# Patient Record
Sex: Female | Born: 1980 | Race: White | Hispanic: Yes | Marital: Single | Smoking: Never smoker
Health system: Southern US, Community
[De-identification: ages and names within clinical notes are randomized; demographics above are authoritative.]

## PROBLEM LIST (undated history)

## (undated) DIAGNOSIS — O24410 Gestational diabetes mellitus in pregnancy, diet controlled: Secondary | ICD-10-CM

## (undated) DIAGNOSIS — E119 Type 2 diabetes mellitus without complications: Secondary | ICD-10-CM

## (undated) DIAGNOSIS — Z789 Other specified health status: Secondary | ICD-10-CM

## (undated) DIAGNOSIS — O24419 Gestational diabetes mellitus in pregnancy, unspecified control: Secondary | ICD-10-CM

## (undated) HISTORY — DX: Gestational diabetes mellitus in pregnancy, diet controlled: O24.410

## (undated) HISTORY — PX: NO PAST SURGERIES: SHX2092

---

## 2002-03-10 HISTORY — PX: OTHER SURGICAL HISTORY: SHX169

## 2010-03-10 DIAGNOSIS — O24410 Gestational diabetes mellitus in pregnancy, diet controlled: Secondary | ICD-10-CM

## 2010-03-10 HISTORY — DX: Gestational diabetes mellitus in pregnancy, diet controlled: O24.410

## 2014-11-10 ENCOUNTER — Ambulatory Visit: Payer: Self-pay

## 2014-12-20 ENCOUNTER — Ambulatory Visit (INDEPENDENT_AMBULATORY_CARE_PROVIDER_SITE_OTHER): Payer: Self-pay | Admitting: Internal Medicine

## 2014-12-20 ENCOUNTER — Encounter: Payer: Self-pay | Admitting: Internal Medicine

## 2014-12-20 VITALS — BP 118/70 | HR 66 | Ht <= 58 in | Wt 139.0 lb

## 2014-12-20 DIAGNOSIS — D2371 Other benign neoplasm of skin of right lower limb, including hip: Secondary | ICD-10-CM

## 2014-12-20 NOTE — Progress Notes (Signed)
   Subjective:    Patient ID: Laurie Mahoney, female    DOB: 1980-09-01, 34 y.o.   MRN: [id number]  HPI  Has a mole or pimple on the right leg for 1 year.  Has grown with time.  No bleeding, edges well demarcated, itching or flaking, but getting darker.  Not in an area where gets a lot of sun exposure in past.     Review of Systems     Objective:   Physical Exam Inner mid right thigh:   3-4 mm smooth domed lesion pink with brown coloring ringing dome.  Firm to touch.      Assessment & Plan:  Dermatofibroma:  Discussed generally benign nature and with possibility of causing more skin changes with removing, to leave alone for now. To call if develops sudden increased growth, bleeding, color change, pain or itching from the lesion.

## 2015-02-22 ENCOUNTER — Ambulatory Visit: Payer: No Typology Code available for payment source | Admitting: Internal Medicine

## 2015-03-15 ENCOUNTER — Ambulatory Visit: Payer: No Typology Code available for payment source | Admitting: Internal Medicine

## 2017-05-28 ENCOUNTER — Encounter: Payer: Self-pay | Admitting: Internal Medicine

## 2017-05-28 ENCOUNTER — Ambulatory Visit: Payer: Self-pay | Admitting: Internal Medicine

## 2017-05-28 VITALS — BP 120/70 | HR 80 | Resp 14 | Ht <= 58 in | Wt 134.0 lb

## 2017-05-28 DIAGNOSIS — Z3009 Encounter for other general counseling and advice on contraception: Secondary | ICD-10-CM

## 2017-05-28 DIAGNOSIS — Z3169 Encounter for other general counseling and advice on procreation: Secondary | ICD-10-CM

## 2017-05-28 MED ORDER — PRENATAL VITAMIN 27-0.8 MG PO TABS: 1.0000 | ORAL_TABLET | Freq: Every day | ORAL | 11 refills | Status: AC

## 2017-05-28 NOTE — Progress Notes (Signed)
Subjective:    Patient ID: Laurie Mahoney, female    DOB: 07/04/1980, 37 y.o.   MRN: [id number]  HPI   Here to reestablish  She would like to have another child, but has been unable to get pregnant.  Her child is 7 currently. She is with a different partner than the father of her 4 yo, but he has had children of his own in past.   Her periods are regular.  She has flow for 3-4 days. LNMP 05/12/2017 They do not have intercourse very often.   Patient finally shares that he lives in [place] and they are not often together. Not taking PNV yet  No outpatient medications have been marked as taking for the 05/28/17 encounter (Office Visit) with Mack Hook, MD.   No Known Allergies   Past Medical History:  Diagnosis Date  . Diet controlled gestational diabetes mellitus (GDM) 2012   Past Surgical History:  Procedure Laterality Date  . Epidermal cyst excision of skin Right 2004    No family history on file.   Social History   Socioeconomic History  . Marital status: Single    Spouse name: Not on file  . Number of children: 1  . Years of education: 54  . Highest education level: Not on file  Occupational History  . Occupation: Presenter, broadcasting: [employer]  . Financial resource strain: Not on file  . Food insecurity:    Worry: Not on file    Inability: Not on file  . Transportation needs:    Medical: Not on file    Non-medical: Not on file  Tobacco Use  . Smoking status: Never Smoker  . Smokeless tobacco: Never Used  Substance and Sexual Activity  . Alcohol use: No    Alcohol/week: 0.0 oz  . Drug use: No  . Sexual activity: Not on file  Lifestyle  . Physical activity:    Days per week: Not on file    Minutes per session: Not on file  . Stress: Not on file  Relationships  . Social connections:    Talks on phone: Not on file    Gets together: Not on file    Attends religious service: Not on file    Active member  of club or organization: Not on file    Attends meetings of clubs or organizations: Not on file    Relationship status: Not on file  . Intimate partner violence:    Fear of current or ex partner: Not on file    Emotionally abused: Not on file    Physically abused: Not on file    Forced sexual activity: Not on file  Other Topics Concern  . Not on file  Social History Narrative   Originally from [place]   Came to [place] In 2003   Lives at home with her brother and her young daughter.       Review of Systems     Objective:   Physical Exam  NAD HEENT:  PERRL, EOMI, TMs pearly gray, throat without injection Neck:  Supple, No adenopathy, No thyromegaly Chest:  CTA CV:  RRR with normal S1 and S2, No S3, S4 or murmur.  Radial and DP pulses normal and equal . Abd:  S, NT, No HSM or mass, + BS      Assessment & Plan:  1.  Family planning/?infertility:  History more supportive of couple not being together during patient's ovulation.  Discussed  ovulation generally occurs about 2 weeks after first day of last period, if cycles are regular.  Would recommend intercourse every 2 days during week of ovulation, starting 2 days before she expects to ovulate.  Would need to keep track of her cycles on calendar to get a better idea of timing. To start PNV in preparation for possible pregnancy.

## 2017-09-03 ENCOUNTER — Ambulatory Visit: Payer: Self-pay | Admitting: Internal Medicine

## 2017-09-03 ENCOUNTER — Encounter: Payer: Self-pay | Admitting: Internal Medicine

## 2017-09-03 VITALS — BP 122/72 | HR 64 | Resp 12 | Ht <= 58 in | Wt 137.0 lb

## 2017-09-03 DIAGNOSIS — N898 Other specified noninflammatory disorders of vagina: Secondary | ICD-10-CM

## 2017-09-03 DIAGNOSIS — E782 Mixed hyperlipidemia: Secondary | ICD-10-CM

## 2017-09-03 DIAGNOSIS — Z124 Encounter for screening for malignant neoplasm of cervix: Secondary | ICD-10-CM

## 2017-09-03 DIAGNOSIS — F439 Reaction to severe stress, unspecified: Secondary | ICD-10-CM

## 2017-09-03 DIAGNOSIS — Z8639 Personal history of other endocrine, nutritional and metabolic disease: Secondary | ICD-10-CM

## 2017-09-03 DIAGNOSIS — Z0000 Encounter for general adult medical examination without abnormal findings: Secondary | ICD-10-CM

## 2017-09-03 LAB — POCT WET PREP WITH KOH
KOH Prep POC: NEGATIVE
RBC WET PREP PER HPF POC: NEGATIVE
Trichomonas, UA: NEGATIVE
YEAST WET PREP PER HPF POC: NEGATIVE

## 2017-09-03 NOTE — Progress Notes (Signed)
Subjective:    Patient ID: Laurie Mahoney, female    DOB: 09/12/1980, 37 y.o.   MRN: [id number]  HPI   CPE with pap  1.  Pap:  Last pap was 3 years ago.  Always normal.  2.  Mammogram:  Never.  No family history of breast cancer.  3.  Osteoprevention:  Milk once daily, yogurt and cheese 3 times weekly.  She is physically active daily for 60 minutes daily with jogging or walking.  No family history of osteoporosis.  4.  Guaiac Cards:  Never.  No family history of colon cancer.  5.  Colonoscopy:  Never  6.  Immunizations: She had Tdap in hospital in [place].  7.  Glucose/Cholesterol:  History of GDM with her daughter.  Has been told her cholesterol is a bit elevated.    No outpatient medications have been marked as taking for the 09/03/17 encounter (Office Visit) with Mack Hook, MD.    No Known Allergies   Past Medical History:  Diagnosis Date  . Diet controlled gestational diabetes mellitus (GDM) 2012    Past Surgical History:  Procedure Laterality Date  . Epidermal cyst excision of skin Right 2004    No family history on file.   Social History   Socioeconomic History  . Marital status: Single    Spouse name: Not on file  . Number of children: 1  . Years of education: 76  . Highest education level: Not on file  Occupational History  . Occupation: Presenter, broadcasting: [employer]  . Financial resource strain: Not on file  . Food insecurity:    Worry: Never true    Inability: Never true  . Transportation needs:    Medical: No    Non-medical: No  Tobacco Use  . Smoking status: Never Smoker  . Smokeless tobacco: Never Used  Substance and Sexual Activity  . Alcohol use: No    Alcohol/week: 0.0 oz  . Drug use: No  . Sexual activity: Not Currently    Birth control/protection: None    Comment: No longer with her previous boyfriend.  Lifestyle  . Physical activity:    Days per week: 7 days    Minutes  per session: 60 min  . Stress: Only a little  Relationships  . Social connections:    Talks on phone: More than three times a week    Gets together: Never    Attends religious service: More than 4 times per year    Active member of club or organization: No    Attends meetings of clubs or organizations: Never    Relationship status: Not on file  . Intimate partner violence:    Fear of current or ex partner: No    Emotionally abused: No    Physically abused: No    Forced sexual activity: No  Other Topics Concern  . Not on file  Social History Narrative   [place]ame to [place] In [place]   Lives at home with her brother and her young daughter.   Daughter stays with mom's friend during the day as out of school currently   She is thinking about cutting back on work hours to spend more time with her daughter.    Review of Systems  Constitutional: Negative for appetite change and fatigue.  HENT: Negative for dental problem, ear pain, hearing loss, rhinorrhea, sinus pain and sore throat.   Eyes: Negative for visual disturbance.  Respiratory: Negative for  cough and shortness of breath.   Cardiovascular: Negative for chest pain, palpitations and leg swelling.  Gastrointestinal: Negative for abdominal pain, blood in stool, constipation and diarrhea.  Genitourinary: Negative for dysuria, frequency, menstrual problem and vaginal discharge.  Musculoskeletal:       Right hand sometimes hurts--feels related to work  Neurological: Positive for numbness (sometimes of fingers of right hand, she has not paid attention to which ones.). Negative for weakness.  Psychiatric/Behavioral: Negative for dysphoric mood. The patient is not nervous/anxious.        Objective:   Physical Exam  Constitutional: She is oriented to person, place, and time. She appears well-developed and well-nourished.  HENT:  Head: Normocephalic and atraumatic.  Right Ear: Hearing, tympanic membrane, external ear and ear canal normal.    Left Ear: Hearing, tympanic membrane, external ear and ear canal normal.  Nose: Nose normal.  Mouth/Throat: Uvula is midline, oropharynx is clear and moist and mucous membranes are normal.  Eyes: Pupils are equal, round, and reactive to light. Conjunctivae and EOM are normal.  Discs sharp bilaterally  Neck: Normal range of motion and full passive range of motion without pain. Neck supple. No thyromegaly present.  Cardiovascular: Normal rate, regular rhythm, S1 normal and S2 normal. Exam reveals no S3, no S4 and no friction rub.  No murmur heard. Carotid, radial, femoral, DP and PT pulses normal and equal. No LE edema.  Pulmonary/Chest: Effort normal and breath sounds normal. Right breast exhibits no inverted nipple, no mass, no nipple discharge, no skin change and no tenderness. Left breast exhibits no inverted nipple, no mass, no nipple discharge, no skin change and no tenderness.  Abdominal: Soft. Bowel sounds are normal. She exhibits no mass. There is no hepatosplenomegaly. There is no tenderness. No hernia.  Genitourinary:  Genitourinary Comments: Normal external female genitalia. No vaginal or cervical lesions. Mild yellow vaginal discharge. No uterine or adnexal mass or tenderness.   Musculoskeletal: Normal range of motion.  Lymphadenopathy:       Head (right side): No submental and no submandibular adenopathy present.       Head (left side): No submental and no submandibular adenopathy present.    She has no cervical adenopathy.    She has no axillary adenopathy.       Right: No inguinal and no supraclavicular adenopathy present.       Left: No inguinal and no supraclavicular adenopathy present.  Neurological: She is alert and oriented to person, place, and time. She has normal strength and normal reflexes. No cranial nerve deficit or sensory deficit. Coordination and gait normal.  Skin: Skin is warm. Capillary refill takes less than 2 seconds. No lesion and no rash noted.   Psychiatric: She has a normal mood and affect. Her speech is normal and behavior is normal. Judgment and thought content normal. Cognition and memory are normal.          Assessment & Plan:  1.  CPE with pap As with some vaginal discharge, check GC/chlamydia. Wet prep supports BV, but patient asymptomatic, so hold on treatment. CBC  2.  Hyperlipidemia:  FLP  3.  Hyperglycemia history:  CMP  4.  Stress:  Referral to Macie Burows, LCSW

## 2017-09-04 LAB — CBC WITH DIFFERENTIAL/PLATELET
Basophils Absolute: 0 10*3/uL (ref 0.0–0.2)
Basos: 0 %
EOS (ABSOLUTE): 0 10*3/uL (ref 0.0–0.4)
EOS: 1 %
HEMATOCRIT: 36.4 % (ref 34.0–46.6)
Hemoglobin: 12.5 g/dL (ref 11.1–15.9)
IMMATURE GRANULOCYTES: 0 %
Immature Grans (Abs): 0 10*3/uL (ref 0.0–0.1)
LYMPHS ABS: 1.9 10*3/uL (ref 0.7–3.1)
Lymphs: 29 %
MCH: 29.7 pg (ref 26.6–33.0)
MCHC: 34.3 g/dL (ref 31.5–35.7)
MCV: 87 fL (ref 79–97)
MONOS ABS: 0.3 10*3/uL (ref 0.1–0.9)
Monocytes: 4 %
NEUTROS PCT: 66 %
Neutrophils Absolute: 4.5 10*3/uL (ref 1.4–7.0)
PLATELETS: 298 10*3/uL (ref 150–450)
RBC: 4.21 x10E6/uL (ref 3.77–5.28)
RDW: 14.1 % (ref 12.3–15.4)
WBC: 6.8 10*3/uL (ref 3.4–10.8)

## 2017-09-04 LAB — COMPREHENSIVE METABOLIC PANEL
A/G RATIO: 1.5 (ref 1.2–2.2)
ALT: 16 IU/L (ref 0–32)
AST: 17 IU/L (ref 0–40)
Albumin: 4.4 g/dL (ref 3.5–5.5)
Alkaline Phosphatase: 67 IU/L (ref 39–117)
BUN/Creatinine Ratio: 22 (ref 9–23)
BUN: 13 mg/dL (ref 6–20)
Bilirubin Total: 0.4 mg/dL (ref 0.0–1.2)
CO2: 20 mmol/L (ref 20–29)
Calcium: 9.5 mg/dL (ref 8.7–10.2)
Chloride: 105 mmol/L (ref 96–106)
Creatinine, Ser: 0.6 mg/dL (ref 0.57–1.00)
GFR, EST AFRICAN AMERICAN: 136 mL/min/{1.73_m2} (ref >59)
GFR, EST NON AFRICAN AMERICAN: 118 mL/min/{1.73_m2} (ref >59)
GLOBULIN, TOTAL: 3 g/dL (ref 1.5–4.5)
Glucose: 78 mg/dL (ref 65–99)
POTASSIUM: 4.1 mmol/L (ref 3.5–5.2)
SODIUM: 136 mmol/L (ref 134–144)
Total Protein: 7.4 g/dL (ref 6.0–8.5)

## 2017-09-04 LAB — LIPID PANEL W/O CHOL/HDL RATIO
Cholesterol, Total: 191 mg/dL (ref 100–199)
HDL: 55 mg/dL (ref >39)
LDL Calculated: 119 mg/dL — ABNORMAL HIGH (ref 0–99)
Triglycerides: 83 mg/dL (ref 0–149)
VLDL Cholesterol Cal: 17 mg/dL (ref 5–40)

## 2017-09-04 LAB — CYTOLOGY - PAP

## 2017-09-05 LAB — GC/CHLAMYDIA PROBE AMP
CHLAMYDIA, DNA PROBE: NEGATIVE
Neisseria gonorrhoeae by PCR: NEGATIVE

## 2017-09-25 ENCOUNTER — Other Ambulatory Visit: Payer: Self-pay | Admitting: Licensed Clinical Social Worker

## 2018-03-10 NOTE — L&D Delivery Note (Signed)
OB/GYN Faculty Practice Delivery Note  Laurie Mahoney is a 38 y.o. G2P1001 s/p vaginal delivery at [redacted]w[redacted]d. She was admitted for IOL 2/2 pre-E w/o SF, A1GDM.   ROM: 5h 10m with clear fluid GBS Status: positive Maximum Maternal Temperature: 98.63F  Labor Progress: . Patient was admitted for induction. She received 2 doses of cytotec, and was AROMed with clear fluid. She progressed without pitocin.  Delivery Date/Time: 03/06/2019, 2322 hours Delivery: Called to room and patient was complete and pushing. Head delivered LOA. No nuchal cord present. Shoulder and body delivered in usual fashion. Infant with spontaneous cry, placed on mother's abdomen, dried and stimulated. Cord clamped x 2 after 1-minute delay, and cut by patient under my direct supervision. Cord blood drawn. Placenta delivered spontaneously with gentle cord traction. Fundus firm with massage and Pitocin. Several clots were evacuated from the vaginal. After second fundal rub, there was a large gush of bright red blood, so 1 g TXA was given. Labia, perineum, vagina, and cervix were inspected, and a first degree laceration was noted, repaired with 3-0 Vicryl Rapide in the usual fashion.   Placenta: 3 vessel cord, intact, to labor and delivery Complications: None Lacerations: 1st degree, repaired with 3-0 Vicryl Rapide in the usual fashion. EBL: 282 Analgesia: Epidural  Postpartum Planning [x]  message to sent to schedule follow-up  [x]  vaccines UTD  Infant: female  APGARs 9 & 9  weight per medical record  Merilyn Baba, DO OB/GYN Fellow, Faculty Practice

## 2018-09-01 LAB — OB RESULTS CONSOLE ABO/RH: RH Type: POSITIVE

## 2018-09-01 LAB — OB RESULTS CONSOLE HGB/HCT, BLOOD
HCT: 36 (ref 29–41)
Hemoglobin: 11.7

## 2018-09-01 LAB — OB RESULTS CONSOLE RPR: RPR: NONREACTIVE

## 2018-09-01 LAB — CYSTIC FIBROSIS DIAGNOSTIC STUDY: Interpretation-CFDNA:: NEGATIVE

## 2018-09-01 LAB — OB RESULTS CONSOLE VARICELLA ZOSTER ANTIBODY, IGG: Varicella: IMMUNE

## 2018-09-01 LAB — OB RESULTS CONSOLE RUBELLA ANTIBODY, IGM: Rubella: NON-IMMUNE/NOT IMMUNE

## 2018-09-01 LAB — SICKLE CELL SCREEN: Sickle Cell Screen: NORMAL

## 2018-09-01 LAB — OB RESULTS CONSOLE HIV ANTIBODY (ROUTINE TESTING): HIV: NONREACTIVE

## 2018-09-01 LAB — OB RESULTS CONSOLE GC/CHLAMYDIA
Chlamydia: NEGATIVE
Gonorrhea: NEGATIVE

## 2018-09-01 LAB — OB RESULTS CONSOLE PLATELET COUNT: Platelets: 292

## 2018-09-01 LAB — GLUCOSE, 1 HOUR GESTATIONAL: Glucose 1 Hour: 110

## 2018-09-01 LAB — OB RESULTS CONSOLE ANTIBODY SCREEN: Antibody Screen: NEGATIVE

## 2018-09-30 ENCOUNTER — Encounter: Payer: Self-pay | Admitting: Internal Medicine

## 2019-01-06 LAB — GLUCOSE TOLERANCE, 1 HOUR: Glucose, GTT - 1 Hour: 164 (ref ?–200)

## 2019-01-06 LAB — OB RESULTS CONSOLE HGB/HCT, BLOOD
HCT: 30 (ref 29–41)
Hemoglobin: 9.7

## 2019-01-10 LAB — GLUCOSE TOLERANCE, 3 HOURS
Glucose, GTT - 1 Hour: 221 — AB (ref ?–200)
Glucose, GTT - 2 Hour: 226 — AB (ref ?–140)
Glucose, GTT - 3 Hour: 157 mg/dL — AB (ref ?–140)
Glucose, GTT - Fasting: 103 mg/dL (ref 80–110)

## 2019-01-12 ENCOUNTER — Encounter: Payer: Self-pay | Admitting: *Deleted

## 2019-01-17 ENCOUNTER — Ambulatory Visit (INDEPENDENT_AMBULATORY_CARE_PROVIDER_SITE_OTHER): Payer: Self-pay | Admitting: Family Medicine

## 2019-01-17 ENCOUNTER — Other Ambulatory Visit: Payer: Self-pay

## 2019-01-17 ENCOUNTER — Encounter: Payer: Self-pay | Admitting: Family Medicine

## 2019-01-17 VITALS — BP 131/79 | HR 84 | Wt 146.6 lb

## 2019-01-17 DIAGNOSIS — O280 Abnormal hematological finding on antenatal screening of mother: Secondary | ICD-10-CM

## 2019-01-17 DIAGNOSIS — O0993 Supervision of high risk pregnancy, unspecified, third trimester: Secondary | ICD-10-CM

## 2019-01-17 DIAGNOSIS — Z283 Underimmunization status: Secondary | ICD-10-CM

## 2019-01-17 DIAGNOSIS — Z2839 Other underimmunization status: Secondary | ICD-10-CM

## 2019-01-17 DIAGNOSIS — Z3A30 30 weeks gestation of pregnancy: Secondary | ICD-10-CM

## 2019-01-17 DIAGNOSIS — O0990 Supervision of high risk pregnancy, unspecified, unspecified trimester: Secondary | ICD-10-CM | POA: Insufficient documentation

## 2019-01-17 DIAGNOSIS — O24410 Gestational diabetes mellitus in pregnancy, diet controlled: Secondary | ICD-10-CM

## 2019-01-17 DIAGNOSIS — O09899 Supervision of other high risk pregnancies, unspecified trimester: Secondary | ICD-10-CM | POA: Insufficient documentation

## 2019-01-17 DIAGNOSIS — O99891 Other specified diseases and conditions complicating pregnancy: Secondary | ICD-10-CM

## 2019-01-17 DIAGNOSIS — O09529 Supervision of elderly multigravida, unspecified trimester: Secondary | ICD-10-CM | POA: Insufficient documentation

## 2019-01-17 DIAGNOSIS — O09523 Supervision of elderly multigravida, third trimester: Secondary | ICD-10-CM

## 2019-01-17 NOTE — Progress Notes (Signed)
   Subjective:  Laurie Mahoney is a 38 y.o. G2P1001 at 21w1dbeing seen today for ongoing prenatal care.  She is currently monitored for the following issues for this high-risk pregnancy and has Diet controlled gestational diabetes mellitus (GDM); Supervision of high risk pregnancy, antepartum; Advanced maternal age in multigravida; Abnormal quad screen; and Rubella non-immune status, antepartum on their problem list.  Patient reports no complaints.  Contractions: Not present. Vag. Bleeding: Bloody Show.  Movement: Present. Denies leaking of fluid.   The following portions of the patient's history were reviewed and updated as appropriate: allergies, current medications, past family history, past medical history, past social history, past surgical history and problem list. Problem list updated.  Objective:   Vitals:   01/17/19 1405  BP: 131/79  Pulse: 84  Weight: 146 lb 9.6 oz (66.5 kg)    Fetal Status: Fetal Heart Rate (bpm): 152 Fundal Height: 29 cm Movement: Present     General:  Alert, oriented and cooperative. Patient is in no acute distress.  Skin: Skin is warm and dry. No rash noted.   Cardiovascular: Normal heart rate noted  Respiratory: Normal respiratory effort, no problems with respiration noted  Abdomen: Soft, gravid, appropriate for gestational age. Pain/Pressure: Present     Pelvic: Vag. Bleeding: Bloody Show     Cervical exam deferred        Extremities: Normal range of motion.  Edema: None  Mental Status: Normal mood and affect. Normal behavior. Normal judgment and thought content.   Urinalysis:      Assessment and Plan:  Pregnancy: G2P1001 at 31w1d1. Supervision of high risk pregnancy, antepartum [facility] of [facility] Discussed contraception briefly If NSVD would like OCPs or Depo If needs to have CS would like to have BTL Sign BTL papers at next visit  2. Multigravida of advanced maternal age in third trimester Referred  for growth [place] she will need weekly BPP starting at 36wks - [facility] OB FOLLOW UP; Future  3. Diet controlled gestational diabetes mellitus (GDM) in third trimester A1c today and remaining baseline labs as below Referred to Nutrition for counseling and supplies but had GDM with prior pregnancy and knows how to check sugars/diabetic diet Too late to benefit from prenatal ASA - HgB A1c - Comp Met (CMET) - Protein / creatinine ratio, urine - [place] FOLLOW UP; Future - Referral to Nutrition and Diabetes Services   4. Abnormal quad screen Increased risk for Down Syndrome on quad Undecided on obtaining NIPS for planning purposes/possible financial cost Has [employer] paperwork, will let us[place]ow at next visit  5. Rubella non-immune status, antepartum MMR PP  Preterm labor symptoms and general obstetric precautions including but not limited to vaginal bleeding, contractions, leaking of fluid and fetal movement were reviewed in detail with the patient. Please refer to After Visit Summary for other counseling recommendations.   [facility]31/2019[facility].   EcClarnce FlockMD

## 2019-01-17 NOTE — Patient Instructions (Signed)
° °Lactancia materna °Breastfeeding ° °Decidir amamantar es una de las mejores elecciones que puede hacer por usted y su bebé. Un cambio en las hormonas durante el embarazo hace que las mamas produzcan leche materna en las glándulas productoras de leche. Las hormonas impiden que la leche materna sea liberada antes del nacimiento del bebé. Además, impulsan el flujo de leche luego del nacimiento. Una vez que ha comenzado a amamantar, pensar en el bebé, así como la succión o el llanto, pueden estimular la liberación de leche de las glándulas productoras de leche. °Los beneficios de amamantar °Las investigaciones demuestran que la lactancia materna ofrece muchos beneficios de salud para bebés y madres. Además, ofrece una forma gratuita y conveniente de alimentar al bebé. °Para el bebé °· La primera leche (calostro) ayuda a mejorar el funcionamiento del aparato digestivo del bebé. °· Las células especiales de la leche (anticuerpos) ayudan a combatir las infecciones en el bebé. °· Los bebés que se alimentan con leche materna también tienen menos probabilidades de tener asma, alergias, obesidad o diabetes de tipo 2. Además, tienen menor riesgo de sufrir el síndrome de muerte súbita del lactante (SMSL). °· Los nutrientes de la leche materna son mejores para satisfacer las necesidades del bebé en comparación con la leche maternizada. °· La leche materna mejora el desarrollo cerebral del bebé. °Para usted °· La lactancia materna favorece el desarrollo de un vínculo muy especial entre la madre y el bebé. °· Es conveniente. La leche materna es económica y siempre está disponible a la temperatura correcta. °· La lactancia materna ayuda a quemar calorías. Le ayuda a perder el peso ganado durante el embarazo. °· Hace que el útero vuelva al tamaño que tenía antes del embarazo más rápido. Además, disminuye el sangrado (loquios) después del parto. °· La lactancia materna contribuye a reducir el riesgo de tener diabetes de tipo 2,  osteoporosis, artritis reumatoide, enfermedades cardiovasculares y cáncer de mama, ovario, útero y endometrio en el futuro. °Información básica sobre la lactancia °Comienzo de la lactancia °· Encuentre un lugar cómodo para sentarse o acostarse, con un buen respaldo para el cuello y la espalda. °· Coloque una almohada o una manta enrollada debajo del bebé para acomodarlo a la altura de la mama (si está sentada). Las almohadas para amamantar se han diseñado especialmente a fin de servir de apoyo para los brazos y el bebé mientras amamanta. °· Asegúrese de que la barriga del bebé (abdomen) esté frente a la suya. °· Masajee suavemente la mama. Con las yemas de los dedos, masajee los bordes exteriores de la mama hacia adentro, en dirección al pezón. Esto estimula el flujo de leche. Si la leche fluye lentamente, es posible que deba continuar con este movimiento durante la lactancia. °· Sostenga la mama con 4 dedos por debajo y el pulgar por arriba del pezón (forme la letra “C” con la mano). Asegúrese de que los dedos se encuentren lejos del pezón y de la boca del bebé. °· Empuje suavemente los labios del bebé con el pezón o con el dedo. °· Cuando la boca del bebé se abra lo suficiente, acérquelo rápidamente a la mama e introduzca todo el pezón y la aréola, tanto como sea posible, dentro de la boca del bebé. La aréola es la zona de color que rodea al pezón. °? Debe haber más aréola visible por arriba del labio superior del bebé que por debajo del labio inferior. °? Los labios del bebé deben estar abiertos y extendidos hacia afuera (evertidos) para asegurar   que el bebé se prenda de forma adecuada y cómoda. °? La lengua del bebé debe estar entre la encía inferior y la mama. °· Asegúrese de que la boca del bebé esté en la posición correcta alrededor del pezón (prendido). Los labios del bebé deben crear un sello sobre la mama y estar doblados hacia afuera (invertidos). °· Es común que el bebé succione durante 2 a 3 minutos  para que comience el flujo de leche materna. °Cómo debe prenderse °Es muy importante que le enseñe al bebé cómo prenderse adecuadamente a la mama. Si el bebé no se prende adecuadamente, puede causar dolor en los pezones, reducir la producción de leche materna y hacer que el bebé tenga un escaso aumento de peso. Además, si el bebé no se prende adecuadamente al pezón, puede tragar aire durante la alimentación. Esto puede causarle molestias al bebé. Hacer eructar al bebé al cambiar de mama puede ayudarlo a liberar el aire. Sin embargo, enseñarle al bebé cómo prenderse a la mama adecuadamente es la mejor manera de evitar que se sienta molesto por tragar aire mientras se alimenta. °Signos de que el bebé se ha prendido adecuadamente al pezón °· Tironea o succiona de modo silencioso, sin causarle dolor. Los labios del bebé deben estar extendidos hacia afuera (evertidos). °· Se escucha que traga cada 3 o 4 succiones una vez que la leche ha comenzado a fluir (después de que se produzca el reflejo de eyección de la leche). °· Hay movimientos musculares por arriba y por delante de sus oídos al succionar. °Signos de que el bebé no se ha prendido adecuadamente al pezón °· Hace ruidos de succión o de chasquido mientras se alimenta. °· Siente dolor en los pezones. °Si cree que el bebé no se prendió correctamente, deslice el dedo en la comisura de la boca y colóquelo entre las encías del bebé para interrumpir la succión. Intente volver a comenzar a amamantar. °Signos de lactancia materna exitosa °Signos del bebé °· El bebé disminuirá gradualmente el número de succiones o dejará de succionar por completo. °· El bebé se quedará dormido. °· El cuerpo del bebé se relajará. °· El bebé retendrá una pequeña cantidad de leche en la boca. °· El bebé se desprenderá solo del pecho. °Signos que presenta usted °· Las mamas han aumentado la firmeza, el peso y el tamaño 1 a 3 horas después de amamantar. °· Están más blandas inmediatamente después  de amamantar. °· Se producen un aumento del volumen de leche y un cambio en su consistencia y color hacia el quinto día de lactancia. °· Los pezones no duelen, no están agrietados ni sangran. °Signos de que su bebé recibe la cantidad de leche suficiente °· Mojar por lo menos 1 o 2 pañales durante las primeras 24 horas después del nacimiento. °· Mojar por lo menos 5 o 6 pañales cada 24 horas durante la primera semana después del nacimiento. La orina debe ser clara o de color amarillo pálido a los 5 días de vida. °· Mojar entre 6 y 8 pañales cada 24 horas a medida que el bebé sigue creciendo y desarrollándose. °· Defeca por lo menos 3 veces en 24 horas a los 5 días de vida. Las heces deben ser blandas y amarillentas. °· Defeca por lo menos 3 veces en 24 horas a los 7 días de vida. Las heces deben ser grumosas y amarillentas. °· No registra una pérdida de peso mayor al 10 % del peso al nacer durante los primeros 3 días de vida. °· Aumenta de peso un promedio de 4   a 7 onzas (113 a 198 g) por semana después de los 4 días de vida. °· Aumenta de peso, diariamente, de manera uniforme a partir de los 5 días de vida, sin registrar pérdida de peso después de las 2 semanas de vida. °Después de alimentarse, es posible que el bebé regurgite una pequeña cantidad de leche. Esto es normal. °Frecuencia y duración de la lactancia °El amamantamiento frecuente la ayudará a producir más leche y puede prevenir dolores en los pezones y las mamas extremadamente llenas (congestión mamaria). Alimente al bebé cuando muestre signos de hambre o si siente la necesidad de reducir la congestión de las mamas. Esto se denomina "lactancia a demanda". Las señales de que el bebé tiene hambre incluyen las siguientes: °· Aumento del estado de alerta, actividad o inquietud. °· Mueve la cabeza de un lado a otro. °· Abre la boca cuando se le toca la mejilla o la comisura de la boca (reflejo de búsqueda). °· Aumenta las vocalizaciones, tales como sonidos de  succión, se relame los labios, emite arrullos, suspiros o chirridos. °· Mueve la mano hacia la boca y se chupa los dedos o las manos. °· Está molesto o llora. °Evite el uso del chupete en las primeras 4 a 6 semanas después del nacimiento del bebé. Después de este período, podrá usar un chupete. Las investigaciones demostraron que el uso del chupete durante el primer año de vida del bebé disminuye el riesgo de tener el síndrome de muerte súbita del lactante (SMSL). °Permita que el niño se alimente en cada mama todo lo que desee. Cuando el bebé se desprende o se queda dormido mientras se está alimentando de la primera mama, ofrézcale la segunda. Debido a que, con frecuencia, los recién nacidos están somnolientos las primeras semanas de vida, es posible que deba despertar al bebé para alimentarlo. °Los horarios de lactancia varían de un bebé a otro. Sin embargo, las siguientes reglas pueden servir como guía para ayudarla a garantizar que el bebé se alimenta adecuadamente: °· Se puede amamantar a los recién nacidos (bebés de 4 semanas o menos de vida) cada 1 a 3 horas. °· No deben transcurrir más de 3 horas durante el día o 5 horas durante la noche sin que se amamante a los recién nacidos. °· Debe amamantar al bebé un mínimo de 8 veces en un período de 24 horas. °Extracción de leche materna ° °  ° °La extracción y el almacenamiento de la leche materna le permiten asegurarse de que el bebé se alimente exclusivamente de su leche materna, aun en momentos en los que no puede amamantar. Esto tiene especial importancia si debe regresar al trabajo en el período en que aún está amamantando o si no puede estar presente en los momentos en que el bebé debe alimentarse. Su asesor en lactancia puede ayudarla a encontrar un método de extracción que funcione mejor para usted y orientarla sobre cuánto tiempo es seguro almacenar leche materna. °Cómo cuidar las mamas durante la lactancia °Los pezones pueden secarse, agrietarse y doler  durante la lactancia. Las siguientes recomendaciones pueden ayudarla a mantener las mamas humectadas y sanas: °· Evite usar jabón en los pezones. °· Use un sostén de soporte diseñado especialmente para la lactancia materna. Evite usar sostenes con aro o sostenes muy ajustados (sostenes deportivos). °· Seque al aire sus pezones durante 3 a 4 minutos después de amamantar al bebé. °· Utilice solo apósitos de algodón en el sostén para absorber las pérdidas de leche. La pérdida de un poco de leche materna entre   las tomas es normal. °· Utilice lanolina sobre los pezones luego de amamantar. La lanolina ayuda a mantener la humedad normal de la piel. La lanolina pura no es perjudicial (no es tóxica) para el bebé. Además, puede extraer manualmente algunas gotas de leche materna y masajear suavemente esa leche sobre los pezones para que la leche se seque al aire. °Durante las primeras semanas después del nacimiento, algunas mujeres experimentan congestión mamaria. La congestión mamaria puede hacer que sienta las mamas pesadas, calientes y sensibles al tacto. El pico de la congestión mamaria ocurre en el plazo de los 3 a 5 días después del parto. Las siguientes recomendaciones pueden ayudarla a aliviar la congestión mamaria: °· Vacíe por completo las mamas al amamantar o extraer leche. Puede aplicar calor húmedo en las mamas (en la ducha o con toallas húmedas para manos) antes de amamantar o extraer leche. Esto aumenta la circulación y ayuda a que la leche fluya. Si el bebé no vacía por completo las mamas cuando lo amamanta, extraiga la leche restante después de que haya finalizado. °· Aplique compresas de hielo sobre las mamas inmediatamente después de amamantar o extraer leche, a menos que le resulte demasiado incómodo. Haga lo siguiente: °? Ponga el hielo en una bolsa plástica. °? Coloque una toalla entre la piel y la bolsa de hielo. °? Coloque el hielo durante 20 minutos, 2 o 3 veces por día. °· Asegúrese de que el bebé  esté prendido y se encuentre en la posición correcta mientras lo alimenta. °Si la congestión mamaria persiste luego de 48 horas o después de seguir estas recomendaciones, comuníquese con su médico o un asesor en lactancia. °Recomendaciones de salud general durante la lactancia °· Consuma 3 comidas y 3 colaciones saludables todos los días. Las madres bien alimentadas que amamantan necesitan entre 450 y 500 calorías adicionales por día. Puede cumplir con este requisito al aumentar la cantidad de una dieta equilibrada que realice. °· Beba suficiente agua para mantener la orina clara o de color amarillo pálido. °· Descanse con frecuencia, relájese y siga tomando sus vitaminas prenatales para prevenir la fatiga, el estrés y los niveles bajos de vitaminas y minerales en el cuerpo (deficiencias de nutrientes). °· No consuma ningún producto que contenga nicotina o tabaco, como cigarrillos y cigarrillos electrónicos. El bebé puede verse afectado por las sustancias químicas de los cigarrillos que pasan a la leche materna y por la exposición al humo ambiental del tabaco. Si necesita ayuda para dejar de fumar, consulte al médico. °· Evite el consumo de alcohol. °· No consuma drogas ilegales o marihuana. °· Antes de usar cualquier medicamento, hable con el médico. Estos incluyen medicamentos recetados y de venta libre, como también vitaminas y suplementos a base de hierbas. Algunos medicamentos, que pueden ser perjudiciales para el bebé, pueden pasar a través de la leche materna. °· Puede quedar embarazada durante la lactancia. Si se desea un método anticonceptivo, consulte al médico sobre cuáles son las opciones seguras durante la lactancia. °Dónde encontrar más información: °Liga internacional [place]ec[place]: www.llli.org. °Comuníquese con un médico si: °· Siente que quiere dejar de amamantar o se siente frustrada con la lactancia. °· Sus pezones están agrietados o sangran. °· Sus mamas están irritadas, sensibles o  calientes. °· Tiene los siguientes síntomas: °? Dolor en las mamas o en los pezones. °? Un área hinchada en cualquiera de las mamas. °? Fiebre o escalofríos. °? Náuseas o vómitos. °? Drenaje de otro líquido distinto de la leche materna desde los pezones. °· Sus mamas no   se llenan antes de amamantar al bebé para el quinto día después del parto. °· Se siente triste y deprimida. °· El bebé: °? Está demasiado somnoliento como para comer bien. °? Tiene problemas para dormir. °? Tiene más de 1 semana de vida y moja menos de 6 pañales en un periodo de 24 horas. °? No ha aumentado de peso a los 5 días de vida. °· El bebé defeca menos de 3 veces en 24 horas. °· La piel del bebé o las partes blancas de los ojos se vuelven amarillentas. °Solicite ayuda de inmediato si: °· El bebé está muy cansado (letargo) y no se quiere despertar para comer. °· Le sube la fiebre sin causa. °Resumen °· La lactancia materna ofrece muchos beneficios de salud para bebés y madres. °· Intente amamantar a su bebé cuando muestre signos tempranos de hambre. °· Haga cosquillas o empuje suavemente los labios del bebé con el dedo o el pezón para lograr que el bebé abra la boca. Acerque el bebé a la mama. Asegúrese de que la mayor parte de la aréola se encuentre dentro de la boca del bebé. Ofrézcale una mama y haga eructar al bebé antes de pasar a la otra. °· Hable con su médico o asesor en lactancia si tiene dudas o problemas con la lactancia. °Esta información no tiene como fin reemplazar el consejo del médico. Asegúrese de hacerle al médico cualquier pregunta que tenga. °Document Released: 02/24/2005 Document Revised: 05/21/2017 Document Reviewed: 06/16/2016 °Elsevier Patient Education © 2020 [employer] ° °

## 2019-01-18 DIAGNOSIS — O0990 Supervision of high risk pregnancy, unspecified, unspecified trimester: Secondary | ICD-10-CM

## 2019-01-18 LAB — COMPREHENSIVE METABOLIC PANEL
ALT: 15 IU/L (ref 0–32)
AST: 17 IU/L (ref 0–40)
Albumin/Globulin Ratio: 1.2 (ref 1.2–2.2)
Albumin: 3.5 g/dL — ABNORMAL LOW (ref 3.8–4.8)
Alkaline Phosphatase: 159 IU/L — ABNORMAL HIGH (ref 39–117)
BUN/Creatinine Ratio: 16 (ref 9–23)
BUN: 7 mg/dL (ref 6–20)
Bilirubin Total: <0.2 mg/dL (ref 0.0–1.2)
CO2: 19 mmol/L — ABNORMAL LOW (ref 20–29)
Calcium: 9.2 mg/dL (ref 8.7–10.2)
Chloride: 104 mmol/L (ref 96–106)
Creatinine, Ser: 0.44 mg/dL — ABNORMAL LOW (ref 0.57–1.00)
GFR calc Af Amer: 149 mL/min/{1.73_m2} (ref >59)
GFR calc non Af Amer: 129 mL/min/{1.73_m2} (ref >59)
Globulin, Total: 2.9 g/dL (ref 1.5–4.5)
Glucose: 80 mg/dL (ref 65–99)
Potassium: 3.2 mmol/L — ABNORMAL LOW (ref 3.5–5.2)
Sodium: 137 mmol/L (ref 134–144)
Total Protein: 6.4 g/dL (ref 6.0–8.5)

## 2019-01-18 LAB — CBC
Hematocrit: 29.2 % — ABNORMAL LOW (ref 34.0–46.6)
Hemoglobin: 9.6 g/dL — ABNORMAL LOW (ref 11.1–15.9)
MCH: 27.4 pg (ref 26.6–33.0)
MCHC: 32.9 g/dL (ref 31.5–35.7)
MCV: 83 fL (ref 79–97)
Platelets: 308 10*3/uL (ref 150–450)
RBC: 3.51 x10E6/uL — ABNORMAL LOW (ref 3.77–5.28)
RDW: 15.5 % — ABNORMAL HIGH (ref 11.7–15.4)
WBC: 8.4 10*3/uL (ref 3.4–10.8)

## 2019-01-18 LAB — PROTEIN / CREATININE RATIO, URINE
Creatinine, Urine: 29.2 mg/dL
Protein, Ur: 18.2 mg/dL
Protein/Creat Ratio: 623 mg/g creat — ABNORMAL HIGH (ref 0–200)

## 2019-01-18 LAB — HEMOGLOBIN A1C
Est. average glucose Bld gHb Est-mCnc: 117 mg/dL
Hgb A1c MFr Bld: 5.7 % — ABNORMAL HIGH (ref 4.8–5.6)

## 2019-01-19 ENCOUNTER — Other Ambulatory Visit: Payer: Self-pay

## 2019-01-20 ENCOUNTER — Telehealth: Payer: Self-pay | Admitting: Family Medicine

## 2019-01-20 MED ORDER — IRON 325 (65 FE) MG PO TABS: 1.0000 | ORAL_TABLET | ORAL | 0 refills | Status: AC

## 2019-01-20 NOTE — Telephone Encounter (Signed)
Please call patient to let her know that blood tests from her first visit showed two things:  1. There is an abnormal amount of protein in her urine. Because this is late in the pregnancy I am not sure of the cause, but it's possible that this could be an early sign of pre-eclampsia. For right now she DOES NOT have pre-eclampsia but we need to keep a close eye on her.  Let her know that we would like to see her for a BP check at least once a week so that we can monitor more closely. Please schedule her for nurse BP checks in between her q2 week prenatal visits.  2. She has a mild anemia and I will send iron pills to her pharmacy, she should take them every other day at bedtime.

## 2019-01-21 ENCOUNTER — Encounter: Payer: Self-pay | Attending: Family Medicine | Admitting: Registered"

## 2019-01-21 ENCOUNTER — Encounter: Payer: Self-pay | Admitting: Registered"

## 2019-01-21 ENCOUNTER — Ambulatory Visit (HOSPITAL_COMMUNITY): Payer: Self-pay

## 2019-01-21 ENCOUNTER — Other Ambulatory Visit: Payer: Self-pay

## 2019-01-21 DIAGNOSIS — O24410 Gestational diabetes mellitus in pregnancy, diet controlled: Secondary | ICD-10-CM | POA: Insufficient documentation

## 2019-01-21 NOTE — Telephone Encounter (Addendum)
I called Shaquella with [facility] and we were unable to leave a message- no voicemail. We called her contact number and heard a busy signal Linda,RN

## 2019-01-21 NOTE — Progress Notes (Signed)
Patient was seen on 01/21/2019 for Gestational Diabetes self-management education at the [facility]. The following learning objectives were met by the patient during this course:   States the definition of Gestational Diabetes  Describes the effects each nutrient has on blood glucose levels  Demonstrates ability to create a balanced meal plan  Demonstrates carbohydrate counting   States when to check blood glucose levels  Demonstrates proper blood glucose monitoring techniques  States the effect of stress and exercise on blood glucose levels  States the importance of limiting caffeine and abstaining from alcohol and smoking  Blood glucose monitor given: Accu[employer] Guide me Lot # [id number] Exp: 05/25/19 Blood glucose reading: 131 mg/dL  Patient instructed to monitor glucose levels: FBS: 60 - <95; 1 hour: <140; 2 hour: <120  Patient received handouts:  Nutrition Diabetes and Pregnancy, including carb counting list  Patient will be seen for follow-up as needed.

## 2019-01-21 NOTE — Telephone Encounter (Signed)
[employer]pre[employer] and I called patient number several times and no answer or voicemail.  Also called her contact number and no answer. Will send letter.  Dolce Sylvia,RN

## 2019-01-25 ENCOUNTER — Inpatient Hospital Stay (HOSPITAL_COMMUNITY)
Admission: EM | Admit: 2019-01-25 | Discharge: 2019-01-26 | Payer: No Typology Code available for payment source | Attending: Family Medicine | Admitting: Family Medicine

## 2019-01-25 ENCOUNTER — Encounter (HOSPITAL_COMMUNITY): Payer: Self-pay | Admitting: *Deleted

## 2019-01-25 ENCOUNTER — Telehealth: Payer: Self-pay | Admitting: Obstetrics and Gynecology

## 2019-01-25 DIAGNOSIS — Y92410 Unspecified street and highway as the place of occurrence of the external cause: Secondary | ICD-10-CM | POA: Diagnosis not present

## 2019-01-25 DIAGNOSIS — Y999 Unspecified external cause status: Secondary | ICD-10-CM | POA: Diagnosis not present

## 2019-01-25 DIAGNOSIS — Z3A31 31 weeks gestation of pregnancy: Secondary | ICD-10-CM

## 2019-01-25 DIAGNOSIS — O9A213 Injury, poisoning and certain other consequences of external causes complicating pregnancy, third trimester: Secondary | ICD-10-CM

## 2019-01-25 DIAGNOSIS — M549 Dorsalgia, unspecified: Secondary | ICD-10-CM | POA: Diagnosis not present

## 2019-01-25 DIAGNOSIS — Z3A38 38 weeks gestation of pregnancy: Secondary | ICD-10-CM | POA: Diagnosis not present

## 2019-01-25 DIAGNOSIS — Y9389 Activity, other specified: Secondary | ICD-10-CM | POA: Insufficient documentation

## 2019-01-25 DIAGNOSIS — O99891 Other specified diseases and conditions complicating pregnancy: Secondary | ICD-10-CM | POA: Diagnosis not present

## 2019-01-25 HISTORY — DX: Other specified health status: Z78.9

## 2019-01-25 HISTORY — DX: Type 2 diabetes mellitus without complications: E11.9

## 2019-01-25 HISTORY — DX: Gestational diabetes mellitus in pregnancy, unspecified control: O24.419

## 2019-01-25 MED ORDER — LACTATED RINGERS IV BOLUS
1000.0000 mL | Freq: Once | INTRAVENOUS | Status: AC
  Administered 2019-01-25: 1000 mL via INTRAVENOUS
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
## 2019-01-25 NOTE — Telephone Encounter (Signed)
Spanish interpreter Eda attempted to contact patient about her appointment on 11/18 @ 10:15. No answer and Eda was unable to leave a message.

## 2019-01-25 NOTE — ED Notes (Signed)
Per Enid Skeens - [place] states to send pt to MAU
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
## 2019-01-26 ENCOUNTER — Encounter: Payer: Self-pay | Admitting: Registered"

## 2019-01-26 ENCOUNTER — Encounter (HOSPITAL_COMMUNITY): Payer: Self-pay

## 2019-01-26 MED ORDER — COMFORT FIT MATERNITY SUPP LG MISC: 1.0000 [IU] | Freq: Every day | 0 refills | Status: DC

## 2019-01-26 NOTE — MAU Note (Signed)
Pt reports no abdominal pain or back pain. Pt reports +FM

## 2019-01-26 NOTE — Discharge Instructions (Signed)
PREGNANCY SUPPORT BELT: You are not alone, Seventy-five percent of women have some sort of abdominal or back pain at some point in their pregnancy. Your baby is growing at a fast pace, which means that your whole body is rapidly trying to adjust to the changes. As your uterus grows, your back may start feeling a bit under stress and this can result in back or abdominal pain that can go from mild, and therefore bearable, to severe pains that will not allow you to sit or lay down comfortably, When it comes to dealing with pregnancy-related pains and cramps, some pregnant women usually prefer natural remedies, which the market is filled with nowadays. For example, wearing a pregnancy support belt can help ease and lessen your discomfort and pain. WHAT ARE THE BENEFITS OF WEARING A PREGNANCY SUPPORT BELT? A pregnancy support belt provides support to the lower portion of the belly taking some of the weight of the growing uterus and distributing to the other parts of your body. It is designed make you comfortable and gives you extra support. Over the years, the pregnancy apparel market has been studying the needs and wants of pregnant women and they have come up with the most comfortable pregnancy support belts that woman could ever ask for. In fact, you will no longer have to wear a stretched-out or bulky pregnancy belt that is visible underneath your clothes and makes you feel even more uncomfortable. Nowadays, a pregnancy support belt is made of comfortable and stretchy materials that will not irritate your skin but will actually make you feel at ease and you will not even notice you are wearing it. They are easy to put on and adjust during the day and can be worn at night for additional support.  BENEFITS:  Relives Back pain  Relieves Abdominal Muscle and Leg Pain  Stabilizes the Pelvic Ring  Offers a Cushioned Abdominal Lift Pad  Relieves pressure on the Sciatic Nerve Within Minutes WHERE TO GET  YOUR PREGNANCY BELT: [employer] [phone] [address]  [place], [place] [address]    Prevencin de lesiones durante el embarazo Preventing Injuries During Pregnancy  Durante el embarazo, pueden producirse lesiones. Generalmente, las cadas y los accidentes menores no la daan ni daan al beb. No obstante, algunas lesiones pueden causarles dao a usted y a su beb. Informe al mdico si sufre alguna lesin. Qu puedo hacer para evitar lesiones? [place] y los objetos sueltos del piso.  Use un calzado cmodo que tenga buena adherencia. No use zapatos con tacones altos.  Use siempre el cinturn de seguridad en el automvil. El cinturn del regazo siempre debe colocarse por debajo del abdomen. Conduzca siempre de [employer].  No ande en motocicleta. Actividad  No participe en actividades o deportes bruscos y violentos.  Evite lo siguiente: ? Caminar en pisos mojados o resbalosos. ? Levantar ollas pesadas que contengan lquidos en ebullicin o calientes. ? Arreglar problemas [place]. ? Estar cerca del fuego. Instrucciones generales  [employer] de venta libre y los recetados solamente como se lo haya indicado el mdico.  Conozca su tipo de sangre y el tipo de sangre del padre del beb.  Si usted es vctima de violencia domstica: ? Comunquese con el servicio de emergencias de su localidad (911 en los [place]). ? Para obtener ayuda y 10, pngase en contacto con [facility] ([place]). Solicite ayuda de inmediato  si:  Se cae sobre el abdomen o recibe algn golpe fuerte en el abdomen.  Tiene dolor o rigidez en el cuello despus de una cada o lesin.  Tiene dolor de cabeza o problemas de visin despus de sufrir una lesin.  No siente al beb moverse, o el beb no se mueve tanto como lo hace normalmente.  Ha sido vctima de violencia  domstica o cualquier otro tipo de violencia.  Tuvo un accidente automovilstico.  Tiene una hemorragia vaginal.  Tiene una prdida de lquido por la vagina.  Empieza a Education officer, environmental o clicos abdominales (contracciones).  Tiene un dolor muy intenso en la parte inferior de la espalda.  Se siente dbil o pierde la conciencia (se desmaya).  Empieza a vomitar despus de sufrir una lesin.  Ha sufrido una [place]. Resumen  Algunas lesiones que se producen durante el embarazo pueden daar al beb.  Informe al mdico si sufre alguna lesin.  Tome medidas para evitar lesiones. Esto incluye retirar las alfombrillas y los objetos sueltos del piso. Use siempre el cinturn de seguridad en el automvil.  No participe en actividades o deportes bruscos y violentos.  Busque ayuda de inmediato si tiene un accidente o una lesin graves. Esta informacin no tiene Marine scientist el consejo del mdico. Asegrese de hacerle al mdico cualquier pregunta que tenga. Document Released: 06/11/2010 Document Revised: 11/05/2016 Document Reviewed: 11/05/2016 Elsevier Patient Education  2020 [employer].

## 2019-01-27 ENCOUNTER — Other Ambulatory Visit: Payer: Self-pay

## 2019-01-27 ENCOUNTER — Ambulatory Visit (INDEPENDENT_AMBULATORY_CARE_PROVIDER_SITE_OTHER): Payer: Self-pay | Admitting: Obstetrics and Gynecology

## 2019-01-27 ENCOUNTER — Encounter: Payer: Self-pay | Admitting: Obstetrics and Gynecology

## 2019-01-27 VITALS — BP 139/75 | HR 85 | Temp 97.7°F | Wt 146.9 lb

## 2019-01-27 DIAGNOSIS — O09523 Supervision of elderly multigravida, third trimester: Secondary | ICD-10-CM

## 2019-01-27 DIAGNOSIS — Z2839 Other underimmunization status: Secondary | ICD-10-CM

## 2019-01-27 DIAGNOSIS — O0990 Supervision of high risk pregnancy, unspecified, unspecified trimester: Secondary | ICD-10-CM

## 2019-01-27 DIAGNOSIS — O280 Abnormal hematological finding on antenatal screening of mother: Secondary | ICD-10-CM

## 2019-01-27 DIAGNOSIS — O0993 Supervision of high risk pregnancy, unspecified, third trimester: Secondary | ICD-10-CM

## 2019-01-27 DIAGNOSIS — O99891 Other specified diseases and conditions complicating pregnancy: Secondary | ICD-10-CM

## 2019-01-27 DIAGNOSIS — O1403 Mild to moderate pre-eclampsia, third trimester: Secondary | ICD-10-CM

## 2019-01-27 DIAGNOSIS — Z3A31 31 weeks gestation of pregnancy: Secondary | ICD-10-CM

## 2019-01-27 DIAGNOSIS — O1400 Mild to moderate pre-eclampsia, unspecified trimester: Secondary | ICD-10-CM | POA: Insufficient documentation

## 2019-01-27 DIAGNOSIS — Z283 Underimmunization status: Secondary | ICD-10-CM

## 2019-01-27 DIAGNOSIS — O24410 Gestational diabetes mellitus in pregnancy, diet controlled: Secondary | ICD-10-CM
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
## 2019-01-31 ENCOUNTER — Other Ambulatory Visit: Payer: Self-pay

## 2019-01-31 ENCOUNTER — Ambulatory Visit: Payer: Self-pay | Admitting: Lactation Services

## 2019-01-31 DIAGNOSIS — O0990 Supervision of high risk pregnancy, unspecified, unspecified trimester: Secondary | ICD-10-CM

## 2019-01-31 DIAGNOSIS — O24410 Gestational diabetes mellitus in pregnancy, diet controlled: Secondary | ICD-10-CM

## 2019-01-31 MED ORDER — ACCU-CHEK GUIDE VI STRP: ORAL_STRIP | 12 refills | Status: DC

## 2019-01-31 MED ORDER — ACCU-CHEK FASTCLIX LANCETS MISC: 1.0000 | Freq: Four times a day (QID) | 6 refills | Status: AC

## 2019-01-31 NOTE — Progress Notes (Signed)
Pt here in the office with a Accu Chek Guide Meter and Prodigy Lancets and strips. She reports the supplies do not work with her meter.   Pt reports she is not sure how to change out her drums. She was shown how to use and how to load drums. Orders sent to pharmacy for Accu Chek Guide supplies.

## 2019-02-02 ENCOUNTER — Encounter: Payer: Self-pay | Admitting: General Practice

## 2019-02-07 ENCOUNTER — Encounter: Payer: Self-pay | Admitting: *Deleted

## 2019-02-08 ENCOUNTER — Other Ambulatory Visit: Payer: Self-pay | Admitting: *Deleted

## 2019-02-08 DIAGNOSIS — O1403 Mild to moderate pre-eclampsia, third trimester: Secondary | ICD-10-CM

## 2019-02-09 ENCOUNTER — Other Ambulatory Visit: Payer: Self-pay

## 2019-02-09 ENCOUNTER — Ambulatory Visit (HOSPITAL_COMMUNITY)
Admission: RE | Admit: 2019-02-09 | Discharge: 2019-02-09 | Disposition: A | Discharge status: Home / Self Care | Payer: Self-pay | Source: Ambulatory Visit | Attending: Family Medicine | Admitting: Family Medicine

## 2019-02-09 ENCOUNTER — Ambulatory Visit (HOSPITAL_COMMUNITY): Payer: Self-pay | Admitting: *Deleted

## 2019-02-09 ENCOUNTER — Encounter (HOSPITAL_COMMUNITY): Payer: Self-pay

## 2019-02-09 ENCOUNTER — Other Ambulatory Visit: Payer: Self-pay | Admitting: Family Medicine

## 2019-02-09 ENCOUNTER — Ambulatory Visit (HOSPITAL_BASED_OUTPATIENT_CLINIC_OR_DEPARTMENT_OTHER): Payer: Self-pay | Admitting: Maternal & Fetal Medicine

## 2019-02-09 VITALS — BP 131/63 | HR 85 | Temp 97.7°F

## 2019-02-09 DIAGNOSIS — O0990 Supervision of high risk pregnancy, unspecified, unspecified trimester: Secondary | ICD-10-CM

## 2019-02-09 DIAGNOSIS — Z3A33 33 weeks gestation of pregnancy: Secondary | ICD-10-CM

## 2019-02-09 DIAGNOSIS — O280 Abnormal hematological finding on antenatal screening of mother: Secondary | ICD-10-CM | POA: Insufficient documentation

## 2019-02-09 DIAGNOSIS — O24410 Gestational diabetes mellitus in pregnancy, diet controlled: Secondary | ICD-10-CM

## 2019-02-09 DIAGNOSIS — O1403 Mild to moderate pre-eclampsia, third trimester: Secondary | ICD-10-CM

## 2019-02-09 DIAGNOSIS — Z3A37 37 weeks gestation of pregnancy: Secondary | ICD-10-CM

## 2019-02-09 DIAGNOSIS — O0993 Supervision of high risk pregnancy, unspecified, third trimester: Secondary | ICD-10-CM

## 2019-02-09 DIAGNOSIS — O09523 Supervision of elderly multigravida, third trimester: Secondary | ICD-10-CM

## 2019-02-09 DIAGNOSIS — O289 Unspecified abnormal findings on antenatal screening of mother: Secondary | ICD-10-CM

## 2019-02-09 NOTE — Progress Notes (Signed)
MFM Consult  [name] is a 38 yo here for a third trimester anatomy she is here in consult at the request of Clayton Lefort, MD.  She carries the diagnosis of marginal cord insertion, A1GDM, and preeclampsia documented by two blood pressures 140/90 and a UPC of ~600. In addition, she had an abnormal quadscreen but a subsequent low risk NIPS. I met with Laurie Mahoney via interpreter. She denies signs and symptoms of preeclampsia today. I reviewed today's images and total growth was normal. However, due to late gestation suboptimal views of the fetal antomy was seen.  She brought her blood sugars and blood pressure measurements for me to review. She had 16 days of blood sugars fasting and all 2hr pp. Her 2 hr PP were all normal, however >70% of her FBS were > 90 mg/dL but primarily hovering between 92-94 mg/dL. She had a few in the low 100's.  Her blood pressure readings were normal with exception of one 140/80. Again she is asymptomatic.  I reviewed with her the recommendations for glucose control and suggested that we consider a night time metformin of 500 mg to address her fastings.   Secondly, we discussed the risk and complications associated with preeclampsia including preterm delivery, placental abruption, stroke, fetal growth restriction, stillbirth and eclampsia. I reviewed the management to include twice weekly testing, weekly labs (CBC and CMP),serial growth every 3-4 weeks with plan or delvery at 37 weeks.   Laurie Mahoney had a follow up NST/BPP at your offices this week. I recommend continuing the antenaal surveillance schedule. Also consider late steroids at 36 weeks or sooner if labs or maternal symptoms present.  Recommendations  1) 2x weekly antenatal testing (BPP today was 8/8) 2) Serial growth every 3-4 weeks (Follow up growth was scheduled today in 4 weeks.) 3) Weekly labs  4) Consider initiating nightly metformin 500 mg  5) Delivery by 37  6) Consider  betamethasone at 36+ weeks.    I spent 30 minutes with >50% in face to face consultation and care coordination.  All questions answered  Vikki Ports, MD

## 2019-02-10 ENCOUNTER — Ambulatory Visit (INDEPENDENT_AMBULATORY_CARE_PROVIDER_SITE_OTHER): Payer: Self-pay | Admitting: Obstetrics & Gynecology

## 2019-02-10 ENCOUNTER — Other Ambulatory Visit (HOSPITAL_COMMUNITY): Payer: Self-pay | Admitting: *Deleted

## 2019-02-10 ENCOUNTER — Ambulatory Visit (INDEPENDENT_AMBULATORY_CARE_PROVIDER_SITE_OTHER): Payer: Self-pay | Admitting: *Deleted

## 2019-02-10 VITALS — BP 122/64 | HR 82 | Wt 147.7 lb

## 2019-02-10 DIAGNOSIS — O09523 Supervision of elderly multigravida, third trimester: Secondary | ICD-10-CM

## 2019-02-10 DIAGNOSIS — O1403 Mild to moderate pre-eclampsia, third trimester: Secondary | ICD-10-CM

## 2019-02-10 DIAGNOSIS — Z3A33 33 weeks gestation of pregnancy: Secondary | ICD-10-CM

## 2019-02-10 DIAGNOSIS — O24410 Gestational diabetes mellitus in pregnancy, diet controlled: Secondary | ICD-10-CM

## 2019-02-10 DIAGNOSIS — O0993 Supervision of high risk pregnancy, unspecified, third trimester: Secondary | ICD-10-CM

## 2019-02-10 DIAGNOSIS — O1490 Unspecified pre-eclampsia, unspecified trimester: Secondary | ICD-10-CM

## 2019-02-10 DIAGNOSIS — O0990 Supervision of high risk pregnancy, unspecified, unspecified trimester: Secondary | ICD-10-CM

## 2019-02-10 NOTE — Patient Instructions (Signed)
Regrese a la clinica cuando tenga su cita. Si tiene problemas o preguntas, llama a la clinica o vaya a la sala de emergencia al [facility].    

## 2019-02-10 NOTE — Progress Notes (Signed)
PRENATAL VISIT NOTE  Subjective:  Laurie Mahoney is a 38 y.o. G2P1001 at 41w4dbeing seen today for ongoing prenatal care.  Patient is Spanish-speaking only, Spanish interpreter present for this encounter.She is currently monitored for the following issues for this high-risk pregnancy and has Diet controlled gestational diabetes mellitus (GDM); Supervision of high risk pregnancy, antepartum; Advanced maternal age in multigravida; Abnormal quad screen; Rubella non-immune status, antepartum; and Mild pre-eclampsia on their problem list.  Patient reports no complaints.  Contractions: Not present. Vag. Bleeding: None.  Movement: Present. Denies leaking of fluid.   The following portions of the patient's history were reviewed and updated as appropriate: allergies, current medications, past family history, past medical history, past social history, past surgical history and problem list.   Objective:   Vitals:   02/10/19 1056  BP: 122/64  Pulse: 82  Weight: 147 lb 11.2 oz (67 kg)    Fetal Status: Fetal Heart Rate (bpm): NST   Movement: Present     General:  Alert, oriented and cooperative. Patient is in no acute distress.  Skin: Skin is warm and dry. No rash noted.   Cardiovascular: Normal heart rate noted  Respiratory: Normal respiratory effort, no problems with respiration noted  Abdomen: Soft, gravid, appropriate for gestational age.  Pain/Pressure: Present     Pelvic: Cervical exam deferred        Extremities: Normal range of motion.     Mental Status: Normal mood and affect. Normal behavior. Normal judgment and thought content.   Imaging: [place] Fetal Bpp Wo Non Stress  Result Date: 02/09/2019 ----------------------------------------------------------------------  OBSTETRICS REPORT                       (Signed Final 02/09/2019 01:58 pm) ---------------------------------------------------------------------- Patient Info  ID #:       [id number]                          D.O.B.:  [birth year](38 yrs)  Name:       ECloyd Mahoney                Visit Date: 02/09/2019 11:12 am              Mahoney ---------------------------------------------------------------------- Performed By  Performed By:     HNovella Rob       Ref. Address[address]     [address]  [place]  Attending:        Sander Nephew      [facility]                    MD                                       [facility]  Referred By:      Clarnce Flock MD ---------------------------------------------------------------------- Orders   #  Description                          Code         Ordered By   1  [place] MFM OB DETAIL +14 [place]              76811.01     MATTHEW ECKSTAT   2  [place] MFM FETAL BPP WO NON              76819.01     MICHAEL ERVIN      STRESS  ----------------------------------------------------------------------   #  Order #                    Accession #                 Episode #   1  [id number]                  [id number]                  [id number]   2  [id number]                  [id number]                  [id number]  ---------------------------------------------------------------------- Indications   Mild to moderate preeclampsia, third           O14.03   trimester   [redacted] weeks gestation of pregnancy                Z3A.33   Encounter for antenatal screening for          Z36.3   malformations   Gestational diabetes in pregnancy, diet        O24.410   controlled   Abnormal biochemical screen (quad screen       O28.9   abnormal for T21 (1:119)   low risk NIPS   Advanced maternal age multigravida 28+,        O78.523   third trimester  ---------------------------------------------------------------------- Vital Signs  Weight (lb): 146                                Height:        4'10"  BMI:         30.51 ---------------------------------------------------------------------- Fetal Evaluation  Num Of Fetuses:         1  Fetal Heart Rate(bpm):  151  Cardiac Activity:       Observed  Presentation:           Cephalic  Placenta:  Posterior Fundal  P. Cord Insertion:      Marginal insertion  Amniotic Fluid  AFI FV:      Within normal limits  AFI Sum(cm)     %Tile       Largest Pocket(cm)  11.8            32          6.49  RUQ(cm)       RLQ(cm)       LUQ(cm)        LLQ(cm)  6.49          2.93          0              2.38 ---------------------------------------------------------------------- Biophysical Evaluation  Amniotic F.V:   Within normal limits       F. Tone:        Observed  F. Movement:    Observed                   Score:          8/8  F. Breathing:   Observed ---------------------------------------------------------------------- Biometry  BPD:        83  mm     G. Age:  33w 3d         41  %    CI:         79.9   %    70 - 86                                                          FL/HC:      21.5   %    19.9 - 21.5  HC:      293.4  mm     G. Age:  32w 3d          3  %    HC/AC:      0.95        0.96 - 1.11  AC:      307.5  mm     G. Age:  34w 5d         85  %    FL/BPD:     76.1   %    71 - 87  FL:       63.2  mm     G. Age:  32w 5d         21  %    FL/AC:      20.6   %    20 - 24  HUM:        56  mm     G. Age:  32w 4d         40  %  LV:        1.9  mm  Est. FW:    2272  gm           5 lb     52  % ---------------------------------------------------------------------- OB History  Gravidity:    2         Term:   1  Living:       1 ---------------------------------------------------------------------- Gestational Age  U/S Today:     33w 2d  EDD:   03/28/19  Best:          33w 3d     Det. By:  Previous Ultrasound      EDD:   03/27/19                                      (10/28/18)  ---------------------------------------------------------------------- Anatomy  Cranium:               Appears normal         Aortic Arch:            Appears normal  Cavum:                 Appears normal         Ductal Arch:            Not well visualized  Ventricles:            Appears normal         Diaphragm:              Appears normal  Choroid Plexus:        Appears normal         Stomach:                Appears normal, left                                                                        sided  Cerebellum:            Appears normal         Abdomen:                Appears normal  Posterior Fossa:       Not well visualized    Abdominal Wall:         Not well visualized  Nuchal Fold:           Not applicable (>74    Cord Vessels:           Appears normal ([redacted]                         wks GA)                                        vessel cord)  Face:                  Not well visualized    Kidneys:                Appear normal  Lips:                  Not well visualized    Bladder:                Appears normal  Thoracic:              Appears normal         Spine:  Ltd views no                                                                        intracranial signs of                                                                        NTD  Heart:                 Appears normal         Upper Extremities:      Visualized                         (4CH, axis, and                         situs)  RVOT:                  Appears normal         Lower Extremities:      Appears normal  LVOT:                  Appears normal ---------------------------------------------------------------------- Cervix Uterus Adnexa  Cervix  Not visualized (advanced GA >24wks) ---------------------------------------------------------------------- Impression  Normal anatomy and growth  Laurie Mahoney is a 38 yo here for a third trimester  anatomy. She carries the diagnosis of marginal cord  insertion, A1GDM, and  preeclampsia documented by two  blood pressures 140/90 and a UPC of 600. In addition, she  had an abnormal quadscreen but a subsequent low risk  NIPS. I met with Laurie Mahoney via interpreter. She denies  signs and symptoms of preeclampsia today. I reviewed  today's images and total growth was normal. However, due to  late gestation suboptimal views of the fetal antomy was seen.  She brought her blood sugars and blood pressure  measurements for me to review. She had 16 days of blood  sugars fasting and all 2hr pp. Her 2 hr PP were all normal,  however >70% of her FBS were > 90 mg/dL but primarily  hovering between 92-94 mg/dL. She had a few in the low  100's.  Her blood pressure readings were normal with  exception of one 140/80. Again she is asymptomatic.  I reviewed with her the recommendations for glucose control  and suggested that we consider a night time metformin of  500 mg to address her fastings.  Secondly, we discussed the risk and complications  associated with preeclampsia including preterm delivery,  placental abruption, stroke, fetal growth restriction, stillbirth  and eclampsia. I reviewed the management to include twice  weekly testing, weekly labs (CBC and CMP),serial growth  every 3-4 weeks with plan or delvery at 37 weeks.  Laurie Mahoney had a follow up NST/BPP at your  offices this week. I recommend continuing the antenaal  surveillance schedule. Also consider late steroids at 36  weeks  or sooner if labs or maternal symptoms present.  I spent 30 minutes with >50% in face to face consultation and  care coordination. ---------------------------------------------------------------------- Recommendations  1) 2x weekly antenatal testing (BPP today was 8/8)  2) Serial growth every 3-4 weeks (Follow up growth was  scheduled today in 4 weeks.)  3) Weekly labs  4) Consider initiating nightly metformin 500 mg  5) Delivery by 37  6) Consider betamethasone at 36+ weeks.  ----------------------------------------------------------------------               Sander Nephew, MD Electronically Signed Final Report   02/09/2019 01:58 pm ----------------------------------------------------------------------  [place] Mfm Ob Detail +14 Wk  Result Date: 02/09/2019 ----------------------------------------------------------------------  OBSTETRICS REPORT                       (Signed Final 02/09/2019 01:58 pm) ---------------------------------------------------------------------- Patient Info  ID #:       [id number]                          D.O.B.:  Dec 11, 1980 (38 yrs)  Name:       Laurie Mahoney                Visit Date: 02/09/2019 11:12 am              Mahoney ---------------------------------------------------------------------- Performed By  Performed By:     Novella Rob        Ref. Address:     [address] [place]                                                             [place], [place]  Attending:        Sander Nephew      [facility]                    MD                                       Fetal Care  Referred By:      Clarnce Flock MD ---------------------------------------------------------------------- Orders   #  Description  Code         Ordered By   1  [place] MFM OB DETAIL +14 [place]              [id number]     MATTHEW ECKSTAT   2  [place] MFM FETAL BPP WO NON              76819.01     MICHAEL ERVIN      STRESS  ----------------------------------------------------------------------   #  Order #                    Accession #                 Episode #   1  [id number]                  [id number]                  [id number]   2  [id number]                  [id number]                  [id number]   ---------------------------------------------------------------------- Indications   Mild to moderate preeclampsia, third           O14.03   trimester   [redacted] weeks gestation of pregnancy                Z3A.33   Encounter for antenatal screening for          Z36.3   malformations   Gestational diabetes in pregnancy, diet        O24.410   controlled   Abnormal biochemical screen (quad screen       O28.9   abnormal for T21 (1:119)   low risk NIPS   Advanced maternal age multigravida 17+,        O50.523   third trimester  ---------------------------------------------------------------------- Vital Signs  Weight (lb): 146                               Height:        4'10"  BMI:         30.51 ---------------------------------------------------------------------- Fetal Evaluation  Num Of Fetuses:         1  Fetal Heart Rate(bpm):  151  Cardiac Activity:       Observed  Presentation:           Cephalic  Placenta:               Posterior Fundal  P. Cord Insertion:      Marginal insertion  Amniotic Fluid  AFI FV:      Within normal limits  AFI Sum(cm)     %Tile       Largest Pocket(cm)  11.8            32          6.49  RUQ(cm)       RLQ(cm)       LUQ(cm)        LLQ(cm)  6.49          2.93          0              2.38 ---------------------------------------------------------------------- Biophysical Evaluation  Amniotic F.V:   Within normal limits       F.  Tone:        Observed  F. Movement:    Observed                   Score:          8/8  F. Breathing:   Observed ---------------------------------------------------------------------- Biometry  BPD:        83  mm     G. Age:  33w 3d         38  %    CI:         79.9   %    70 - 86                                                          FL/HC:      21.5   %    19.9 - 21.5  HC:      293.4  mm     G. Age:  32w 3d          3  %    HC/AC:      0.95        0.96 - 1.11  AC:      307.5  mm     G. Age:  34w 5d         85  %    FL/BPD:     76.1   %    71 - 87  FL:       63.2  mm     G.  Age:  32w 5d         21  %    FL/AC:      20.6   %    20 - 24  HUM:        56  mm     G. Age:  32w 4d         40  %  LV:        1.9  mm  Est. FW:    2272  gm           5 lb     52  % ---------------------------------------------------------------------- OB History  Gravidity:    2         Term:   1  Living:       1 ---------------------------------------------------------------------- Gestational Age  U/S Today:     33w 2d                                        EDD:   03/28/19  Best:          33w 3d     Det. By:  Previous Ultrasound      EDD:   03/27/19                                      (10/28/18) ---------------------------------------------------------------------- Anatomy  Cranium:               Appears normal         Aortic Arch:            Appears normal  Cavum:                 Appears normal         Ductal Arch:            Not well visualized  Ventricles:            Appears normal         Diaphragm:              Appears normal  Choroid Plexus:        Appears normal         Stomach:                Appears normal, left                                                                        sided  Cerebellum:            Appears normal         Abdomen:                Appears normal  Posterior Fossa:       Not well visualized    Abdominal Wall:         Not well visualized  Nuchal Fold:           Not applicable (>65    Cord Vessels:           Appears normal ([redacted]                         wks GA)                                        vessel cord)  Face:                  Not well visualized    Kidneys:                Appear normal  Lips:                  Not well visualized    Bladder:                Appears normal  Thoracic:              Appears normal         Spine:                  Ltd views no                                                                        intracranial signs of  NTD  Heart:                 Appears normal         Upper  Extremities:      Visualized                         (4CH, axis, and                         situs)  RVOT:                  Appears normal         Lower Extremities:      Appears normal  LVOT:                  Appears normal ---------------------------------------------------------------------- Cervix Uterus Adnexa  Cervix  Not visualized (advanced GA >24wks) ---------------------------------------------------------------------- Impression  Normal anatomy and growth  Laurie Mahoney is a 38 yo here for a third trimester  anatomy. She carries the diagnosis of marginal cord  insertion, A1GDM, and preeclampsia documented by two  blood pressures 140/90 and a UPC of 600. In addition, she  had an abnormal quadscreen but a subsequent low risk  NIPS. I met with Laurie Mahoney via interpreter. She denies  signs and symptoms of preeclampsia today. I reviewed  today's images and total growth was normal. However, due to  late gestation suboptimal views of the fetal antomy was seen.  She brought her blood sugars and blood pressure  measurements for me to review. She had 16 days of blood  sugars fasting and all 2hr pp. Her 2 hr PP were all normal,  however >70% of her FBS were > 90 mg/dL but primarily  hovering between 92-94 mg/dL. She had a few in the low  100's.  Her blood pressure readings were normal with  exception of one 140/80. Again she is asymptomatic.  I reviewed with her the recommendations for glucose control  and suggested that we consider a night time metformin of  500 mg to address her fastings.  Secondly, we discussed the risk and complications  associated with preeclampsia including preterm delivery,  placental abruption, stroke, fetal growth restriction, stillbirth  and eclampsia. I reviewed the management to include twice  weekly testing, weekly labs (CBC and CMP),serial growth  every 3-4 weeks with plan or delvery at 37 weeks.  Laurie Mahoney had a follow up NST/BPP at your  offices this week. I  recommend continuing the antenaal  surveillance schedule. Also consider late steroids at 36  weeks or sooner if labs or maternal symptoms present.  I spent 30 minutes with >50% in face to face consultation and  care coordination. ---------------------------------------------------------------------- Recommendations  1) 2x weekly antenatal testing (BPP today was 8/8)  2) Serial growth every 3-4 weeks (Follow up growth was  scheduled today in 4 weeks.)  3) Weekly labs  4) Consider initiating nightly metformin 500 mg  5) Delivery by 37  6) Consider betamethasone at 36+ weeks. ----------------------------------------------------------------------               Sander Nephew, MD Electronically Signed Final Report   02/09/2019 01:58 pm ----------------------------------------------------------------------   Assessment and Plan:  Pregnancy: G2P1001 at 29w4d1. Mild pre-eclampsia in third trimester Stable BP, will continue to monitor.  NST performed today was reviewed and was found to be reactive.  Continue recommended weekly antenatal testing and prenatal  care.  2. Diet controlled gestational diabetes mellitus (GDM) in third trimester CBGs reviewed. No need for medication, only two fasting values >95 for this week.  Continue diet and exercise.  If more fasting values are abnormal, will initiate Metformin.  3. Multigravida of advanced maternal age in third trimester Stable.  4. Supervision of high risk pregnancy, antepartum Preterm labor symptoms and general obstetric precautions including but not limited to vaginal bleeding, contractions, leaking of fluid and fetal movement were reviewed in detail with the patient. Please refer to After Visit Summary for other counseling recommendations.   Return for Weekly NST, BPP, OFFICE OB Visit until 03/06/2019.  Future Appointments  Date Time Provider [place]  02/10/2019 11:15 AM Jadalee Westcott, Sallyanne Havers, MD WOC-WOCA WOC  03/09/2019 10:30 AM WH-MFC [place] 1  WH-MFCUS MFC-US  03/09/2019 10:40 AM WH-MFC NURSE WH-MFC MFC-US    Verita Schneiders, MD

## 2019-02-10 NOTE — Progress Notes (Signed)
Interpreter [employer] present for encounter.

## 2019-02-17 ENCOUNTER — Ambulatory Visit (INDEPENDENT_AMBULATORY_CARE_PROVIDER_SITE_OTHER): Payer: Self-pay | Admitting: Obstetrics and Gynecology

## 2019-02-17 ENCOUNTER — Ambulatory Visit (INDEPENDENT_AMBULATORY_CARE_PROVIDER_SITE_OTHER): Payer: Self-pay | Admitting: *Deleted

## 2019-02-17 ENCOUNTER — Ambulatory Visit: Payer: Self-pay

## 2019-02-17 ENCOUNTER — Other Ambulatory Visit: Payer: Self-pay

## 2019-02-17 VITALS — BP 127/71 | HR 92 | Wt 149.3 lb

## 2019-02-17 DIAGNOSIS — O0993 Supervision of high risk pregnancy, unspecified, third trimester: Secondary | ICD-10-CM

## 2019-02-17 DIAGNOSIS — Z3A34 34 weeks gestation of pregnancy: Secondary | ICD-10-CM

## 2019-02-17 DIAGNOSIS — O1403 Mild to moderate pre-eclampsia, third trimester: Secondary | ICD-10-CM

## 2019-02-17 DIAGNOSIS — O24410 Gestational diabetes mellitus in pregnancy, diet controlled: Secondary | ICD-10-CM

## 2019-02-17 DIAGNOSIS — O1493 Unspecified pre-eclampsia, third trimester: Secondary | ICD-10-CM

## 2019-02-17 DIAGNOSIS — O09899 Supervision of other high risk pregnancies, unspecified trimester: Secondary | ICD-10-CM

## 2019-02-17 DIAGNOSIS — O09523 Supervision of elderly multigravida, third trimester: Secondary | ICD-10-CM

## 2019-02-17 DIAGNOSIS — Z283 Underimmunization status: Secondary | ICD-10-CM

## 2019-02-17 DIAGNOSIS — O280 Abnormal hematological finding on antenatal screening of mother: Secondary | ICD-10-CM

## 2019-02-17 DIAGNOSIS — O99891 Other specified diseases and conditions complicating pregnancy: Secondary | ICD-10-CM

## 2019-02-17 DIAGNOSIS — Z789 Other specified health status: Secondary | ICD-10-CM

## 2019-02-17 DIAGNOSIS — O0990 Supervision of high risk pregnancy, unspecified, unspecified trimester: Secondary | ICD-10-CM

## 2019-02-17 NOTE — Progress Notes (Signed)
IOL scheduled on 12/27 - daytime.

## 2019-02-17 NOTE — Progress Notes (Signed)
   PRENATAL VISIT NOTE  Subjective:  Laurie Mahoney is a 38 y.o. G2P1001 at 26w4dbeing seen today for ongoing prenatal care.  She is currently monitored for the following issues for this high-risk pregnancy and has Diet controlled gestational diabetes mellitus (GDM); Supervision of high risk pregnancy, antepartum; Advanced maternal age in multigravida; Abnormal quad screen; Rubella non-immune status, antepartum; Mild pre-eclampsia; and Language barrier on their problem list.  Patient reports occasional contractions.  Contractions: Not present. Vag. Bleeding: None.  Movement: Present. Denies leaking of fluid.   The following portions of the patient's history were reviewed and updated as appropriate: allergies, current medications, past family history, past medical history, past social history, past surgical history and problem list.   Objective:   Vitals:   02/17/19 1347  BP: 127/71  Pulse: 92  Weight: 149 lb 4.8 oz (67.7 kg)   Fetal Status: Fetal Heart Rate (bpm): NST   Movement: Present     General:  Alert, oriented and cooperative. Patient is in no acute distress.  Skin: Skin is warm and dry. No rash noted.   Cardiovascular: Normal heart rate noted  Respiratory: Normal respiratory effort, no problems with respiration noted  Abdomen: Soft, gravid, appropriate for gestational age.  Pain/Pressure: Present     Pelvic: Cervical exam deferred        Extremities: Normal range of motion.     Mental Status: Normal mood and affect. Normal behavior. Normal judgment and thought content.   Assessment and Plan:  Pregnancy: G2P1001 at 373w4d1. Supervision of high risk pregnancy, antepartum Swabs next visit  2. Mild pre-eclampsia in third trimester IOL @ 37 weeks Orders for admission placed today  3. Multigravida of advanced maternal age in third trimester  4. Diet controlled gestational diabetes mellitus (GDM) in third trimester FG: mostly < 95 PP: all < 120 Cont no  meds  5. Abnormal quad screen Declined further testing  6. Rubella non-immune status, antepartum MMR pp  7. Language barrier SpPatent attorneysed  Preterm labor symptoms and general obstetric precautions including but not limited to vaginal bleeding, contractions, leaking of fluid and fetal movement were reviewed in detail with the patient. Please refer to After Visit Summary for other counseling recommendations.   Return in about 1 week (around 02/24/2019) for high OB, in person, 36 week swabs.  Future Appointments  Date Time Provider [place]12/17/2020  9:15 AM WOC-WOCA NST WOC-WOCA WOC  02/24/2019 10:15 AM DaSloan LeiterMD WOCly[place]12/23/2020  2:15 PM WOC-WOCA NST [place]OC  03/02/2019  3:15 PM PiAletha HalimMD WOC-WOCA WOC  03/09/2019 10:30 AM WH-MFC [place] WH-MFCUS MFC-US  03/09/2019 10:40 AM WH-MFC NURSE WH-MFC MFC-US    KeSloan LeiterMD

## 2019-02-17 NOTE — Progress Notes (Signed)
Interpreter [employer] present for encounter.  Pt informed that the ultrasound is considered a limited OB ultrasound and is not intended to be a complete ultrasound exam.  Patient also informed that the ultrasound is not being completed with the intent of assessing for fetal or placental anomalies or any pelvic abnormalities.  Explained that the purpose of today's ultrasound is to assess for presentation, BPP and amniotic fluid volume.  Patient acknowledges the purpose of the exam and the limitations of the study.

## 2019-02-21 ENCOUNTER — Encounter: Payer: Self-pay | Admitting: General Practice

## 2019-02-22 ENCOUNTER — Telehealth (HOSPITAL_COMMUNITY): Payer: Self-pay | Admitting: *Deleted

## 2019-02-22 NOTE — Telephone Encounter (Signed)
[place] interpreter number  Preadmission screen

## 2019-02-24 ENCOUNTER — Ambulatory Visit (INDEPENDENT_AMBULATORY_CARE_PROVIDER_SITE_OTHER): Payer: Self-pay | Admitting: Obstetrics and Gynecology

## 2019-02-24 ENCOUNTER — Ambulatory Visit (INDEPENDENT_AMBULATORY_CARE_PROVIDER_SITE_OTHER): Payer: Self-pay | Admitting: *Deleted

## 2019-02-24 ENCOUNTER — Ambulatory Visit: Payer: Self-pay

## 2019-02-24 ENCOUNTER — Encounter: Payer: Self-pay | Admitting: Obstetrics and Gynecology

## 2019-02-24 ENCOUNTER — Other Ambulatory Visit: Payer: Self-pay

## 2019-02-24 VITALS — BP 143/76 | HR 81 | Wt 150.4 lb

## 2019-02-24 DIAGNOSIS — O280 Abnormal hematological finding on antenatal screening of mother: Secondary | ICD-10-CM

## 2019-02-24 DIAGNOSIS — O1403 Mild to moderate pre-eclampsia, third trimester: Secondary | ICD-10-CM

## 2019-02-24 DIAGNOSIS — O09523 Supervision of elderly multigravida, third trimester: Secondary | ICD-10-CM

## 2019-02-24 DIAGNOSIS — Z789 Other specified health status: Secondary | ICD-10-CM

## 2019-02-24 DIAGNOSIS — O1493 Unspecified pre-eclampsia, third trimester: Secondary | ICD-10-CM

## 2019-02-24 DIAGNOSIS — Z2839 Other underimmunization status: Secondary | ICD-10-CM

## 2019-02-24 DIAGNOSIS — O24410 Gestational diabetes mellitus in pregnancy, diet controlled: Secondary | ICD-10-CM

## 2019-02-24 DIAGNOSIS — O99891 Other specified diseases and conditions complicating pregnancy: Secondary | ICD-10-CM

## 2019-02-24 DIAGNOSIS — O0990 Supervision of high risk pregnancy, unspecified, unspecified trimester: Secondary | ICD-10-CM

## 2019-02-24 DIAGNOSIS — O0993 Supervision of high risk pregnancy, unspecified, third trimester: Secondary | ICD-10-CM

## 2019-02-24 DIAGNOSIS — Z3A35 35 weeks gestation of pregnancy: Secondary | ICD-10-CM

## 2019-02-24 DIAGNOSIS — Z283 Underimmunization status: Secondary | ICD-10-CM

## 2019-02-24 DIAGNOSIS — Z113 Encounter for screening for infections with a predominantly sexual mode of transmission: Secondary | ICD-10-CM

## 2019-02-24 NOTE — Progress Notes (Signed)
   PRENATAL VISIT NOTE  Subjective:  Laurie Mahoney is a 38 y.o. G2P1001 at 73w4dbeing seen today for ongoing prenatal care.  She is currently monitored for the following issues for this high-risk pregnancy and has Diet controlled gestational diabetes mellitus (GDM); Supervision of high risk pregnancy, antepartum; Advanced maternal age in multigravida; Abnormal quad screen; Rubella non-immune status, antepartum; Mild pre-eclampsia; and Language barrier on their problem list.  Patient reports occasional contractions.  Contractions: Irregular. Vag. Bleeding: None.  Movement: Present. Denies leaking of fluid.   The following portions of the patient's history were reviewed and updated as appropriate: allergies, current medications, past family history, past medical history, past social history, past surgical history and problem list.   Objective:   Vitals:   02/24/19 1028  BP: (!) 143/76  Pulse: 81  Weight: 150 lb 6.4 oz (68.2 kg)   Fetal Status: Fetal Heart Rate (bpm): 162   Movement: Present     General:  Alert, oriented and cooperative. Patient is in no acute distress.  Skin: Skin is warm and dry. No rash noted.   Cardiovascular: Normal heart rate noted  Respiratory: Normal respiratory effort, no problems with respiration noted  Abdomen: Soft, gravid, appropriate for gestational age.  Pain/Pressure: Present     Pelvic: Cervical exam deferred        Extremities: Normal range of motion.  Edema: Trace  Mental Status: Normal mood and affect. Normal behavior. Normal judgment and thought content.   Assessment and Plan:  Pregnancy: G2P1001 at 311w4d1. Supervision of high risk pregnancy, antepartum - Culture, beta strep (group b only) - Cervicovaginal ancillary only( [place] 2. Multigravida of advanced maternal age in third trimester  3. Abnormal quad screen Increased risk DS  4. Rubella non-immune status, antepartum MMR pp  5. Diet controlled gestational  diabetes mellitus (GDM) in third trimester Mostly under good control, occasional elevated FG Cont diet control  6. Mild pre-eclampsia in third trimester Mildly elevated BP today For IOL @ 37 weeks Orders previously placed  7. Language barrier SpPatent attorneysed   Preterm labor symptoms and general obstetric precautions including but not limited to vaginal bleeding, contractions, leaking of fluid and fetal movement were reviewed in detail with the patient. Please refer to After Visit Summary for other counseling recommendations.   Return in about 1 week (around 03/03/2019) for high OB, in person.  Future Appointments  Date Time Provider [place]12/23/2020  2:15 PM WOC-WOCA NST WOColonyOC  03/02/2019  3:15 PM PiAletha HalimMD WOPass Christian12/24/2020  9:15 AM MC-SCREENING MC-SDSC None  03/06/2019  8:25 AM MC-LD SCHED ROOM MC-INDC None  03/09/2019 10:30 AM WH-MFC [place] WH-MFCUS MFC-US  03/09/2019 10:40 AM WH-MFC NURSE WH-MFC MFC-US    KeSloan LeiterMD \

## 2019-02-25 ENCOUNTER — Telehealth (HOSPITAL_COMMUNITY): Payer: Self-pay | Admitting: *Deleted

## 2019-02-25 LAB — CERVICOVAGINAL ANCILLARY ONLY
Chlamydia: NEGATIVE
Comment: NEGATIVE
Comment: NORMAL
Neisseria Gonorrhea: NEGATIVE

## 2019-02-25 NOTE — Telephone Encounter (Signed)
Preadmission screen [phone] interpreter number

## 2019-02-27 LAB — CULTURE, BETA STREP (GROUP B ONLY): Strep Gp B Culture: POSITIVE — AB

## 2019-02-28 ENCOUNTER — Telehealth (HOSPITAL_COMMUNITY): Payer: Self-pay | Admitting: *Deleted

## 2019-02-28 NOTE — Telephone Encounter (Signed)
[id number] interpreter number  Preadmission screen

## 2019-03-01 ENCOUNTER — Telehealth (HOSPITAL_COMMUNITY): Payer: Self-pay | Admitting: *Deleted

## 2019-03-01 NOTE — Telephone Encounter (Signed)
Preadmission screen  

## 2019-03-02 ENCOUNTER — Telehealth (HOSPITAL_COMMUNITY): Payer: Self-pay | Admitting: *Deleted

## 2019-03-02 ENCOUNTER — Other Ambulatory Visit: Payer: Self-pay

## 2019-03-02 ENCOUNTER — Ambulatory Visit (INDEPENDENT_AMBULATORY_CARE_PROVIDER_SITE_OTHER): Payer: Self-pay | Admitting: Obstetrics and Gynecology

## 2019-03-02 ENCOUNTER — Ambulatory Visit: Payer: Self-pay

## 2019-03-02 ENCOUNTER — Ambulatory Visit (INDEPENDENT_AMBULATORY_CARE_PROVIDER_SITE_OTHER): Payer: Self-pay | Admitting: *Deleted

## 2019-03-02 VITALS — BP 132/77 | HR 82 | Wt 150.2 lb

## 2019-03-02 DIAGNOSIS — Z789 Other specified health status: Secondary | ICD-10-CM

## 2019-03-02 DIAGNOSIS — O09523 Supervision of elderly multigravida, third trimester: Secondary | ICD-10-CM

## 2019-03-02 DIAGNOSIS — O0990 Supervision of high risk pregnancy, unspecified, unspecified trimester: Secondary | ICD-10-CM

## 2019-03-02 DIAGNOSIS — O1403 Mild to moderate pre-eclampsia, third trimester: Secondary | ICD-10-CM

## 2019-03-02 DIAGNOSIS — O320XX0 Maternal care for unstable lie, not applicable or unspecified: Secondary | ICD-10-CM | POA: Insufficient documentation

## 2019-03-02 DIAGNOSIS — O0993 Supervision of high risk pregnancy, unspecified, third trimester: Secondary | ICD-10-CM

## 2019-03-02 DIAGNOSIS — Z3A36 36 weeks gestation of pregnancy: Secondary | ICD-10-CM

## 2019-03-02 DIAGNOSIS — O24410 Gestational diabetes mellitus in pregnancy, diet controlled: Secondary | ICD-10-CM

## 2019-03-02 NOTE — Progress Notes (Signed)
Prenatal Visit Note Date: 03/02/2019 Clinic: [facility]  Subjective:  Lashanta Gagne is a 38 y.o. G2P1001 at [redacted]w[redacted]d being seen today for ongoing prenatal care.  She is currently monitored for the following issues for this high-risk pregnancy and has Diet controlled gestational diabetes mellitus (GDM); Supervision of high risk pregnancy, antepartum; Advanced maternal age in multigravida; Abnormal quad screen; Rubella non-immune status, antepartum; Mild pre-eclampsia; Language barrier; and Unstable lie of fetus on their problem list.  Patient reports no complaints.   Contractions: Not present. Vag. Bleeding: None.  Movement: Present. Denies leaking of fluid.   The following portions of the patient's history were reviewed and updated as appropriate: allergies, current medications, past family history, past medical history, past social history, past surgical history and problem list. Problem list updated.  Objective:   Vitals:   03/02/19 1437  BP: 132/77  Pulse: 82  Weight: 150 lb 3.2 oz (68.1 kg)    Fetal Status: Fetal Heart Rate (bpm): NST   Movement: Present     General:  Alert, oriented and cooperative. Patient is in no acute distress.  Skin: Skin is warm and dry. No rash noted.   Cardiovascular: Normal heart rate noted  Respiratory: Normal respiratory effort, no problems with respiration noted  Abdomen: Soft, gravid, appropriate for gestational age. Pain/Pressure: Present     Pelvic:  Cervical exam deferred        Extremities: Normal range of motion.     Mental Status: Normal mood and affect. Normal behavior. Normal judgment and thought content.   Urinalysis:      Assessment and Plan:  Pregnancy: G2P1001 at [redacted]w[redacted]d  1. Supervision of high risk pregnancy, antepartum Routine care. gbs neg - CBC - CMP  2. Mild pre-eclampsia in third trimester No s/s, normal bp today. bpp 8/10 today (-2 breathing). Normal growth earlier this month. Surveillance  labwork today - US FETAL BPP W/NONSTRESS; Future - CBC - CMP  3. Multigravida of advanced maternal age in third trimester - US FETAL BPP W/NONSTRESS; Future  4. Diet controlled gestational diabetes mellitus (GDM) in third trimester Normal CBG numbers  5. Language barrier Interpreter used  6. Unstable fetal lie, single or unspecified fetus Ultrasound on L&D admit recommended  7. Increased Down Syndrome risk Based on quad screen. Low risk Panorama and ultrasound  Preterm labor symptoms and general obstetric precautions including but not limited to vaginal bleeding, contractions, leaking of fluid and fetal movement were reviewed in detail with the patient. Please refer to After Visit Summary for other counseling recommendations.  Return in about 7 weeks (around 04/20/2019) for 2hr GTT, in person postpartum visit.   Aletha Halim, MD

## 2019-03-02 NOTE — Telephone Encounter (Signed)
Preadmission screen  

## 2019-03-02 NOTE — Progress Notes (Signed)
Interpreter Eda Royal present for encounter.  IOL scheduled 12/27

## 2019-03-03 ENCOUNTER — Other Ambulatory Visit (HOSPITAL_COMMUNITY)
Admission: RE | Admit: 2019-03-03 | Discharge: 2019-03-03 | Disposition: A | Discharge status: Home / Self Care | Payer: Self-pay | Source: Ambulatory Visit | Attending: Obstetrics and Gynecology | Admitting: Obstetrics and Gynecology

## 2019-03-03 DIAGNOSIS — Z20828 Contact with and (suspected) exposure to other viral communicable diseases: Secondary | ICD-10-CM | POA: Insufficient documentation

## 2019-03-03 DIAGNOSIS — Z01812 Encounter for preprocedural laboratory examination: Secondary | ICD-10-CM | POA: Insufficient documentation

## 2019-03-03 LAB — CBC
Hematocrit: 31.6 % — ABNORMAL LOW (ref 34.0–46.6)
Hemoglobin: 10.6 g/dL — ABNORMAL LOW (ref 11.1–15.9)
MCH: 28.6 pg (ref 26.6–33.0)
MCHC: 33.5 g/dL (ref 31.5–35.7)
MCV: 85 fL (ref 79–97)
Platelets: 267 10*3/uL (ref 150–450)
RBC: 3.71 x10E6/uL — ABNORMAL LOW (ref 3.77–5.28)
RDW: 16.8 % — ABNORMAL HIGH (ref 11.7–15.4)
WBC: 8.1 10*3/uL (ref 3.4–10.8)

## 2019-03-03 LAB — COMPREHENSIVE METABOLIC PANEL
ALT: 7 IU/L (ref 0–32)
AST: 12 IU/L (ref 0–40)
Albumin/Globulin Ratio: 1.3 (ref 1.2–2.2)
Albumin: 3.5 g/dL — ABNORMAL LOW (ref 3.8–4.8)
Alkaline Phosphatase: 90 IU/L (ref 39–117)
BUN/Creatinine Ratio: 12 (ref 9–23)
BUN: 7 mg/dL (ref 6–20)
Bilirubin Total: <0.2 mg/dL (ref 0.0–1.2)
CO2: 20 mmol/L (ref 20–29)
Calcium: 9.3 mg/dL (ref 8.7–10.2)
Chloride: 100 mmol/L (ref 96–106)
Creatinine, Ser: 0.57 mg/dL (ref 0.57–1.00)
GFR calc Af Amer: 137 mL/min/{1.73_m2} (ref >59)
GFR calc non Af Amer: 119 mL/min/{1.73_m2} (ref >59)
Globulin, Total: 2.8 g/dL (ref 1.5–4.5)
Glucose: 94 mg/dL (ref 65–99)
Potassium: 4 mmol/L (ref 3.5–5.2)
Sodium: 135 mmol/L (ref 134–144)
Total Protein: 6.3 g/dL (ref 6.0–8.5)

## 2019-03-03 LAB — SARS CORONAVIRUS 2 (TAT 6-24 HRS): SARS Coronavirus 2: NEGATIVE

## 2019-03-06 ENCOUNTER — Other Ambulatory Visit: Payer: Self-pay | Admitting: Family Medicine

## 2019-03-06 ENCOUNTER — Inpatient Hospital Stay (HOSPITAL_COMMUNITY): Payer: Medicaid Other | Admitting: Anesthesiology

## 2019-03-06 ENCOUNTER — Other Ambulatory Visit: Payer: Self-pay

## 2019-03-06 ENCOUNTER — Encounter (HOSPITAL_COMMUNITY): Payer: Self-pay | Admitting: Obstetrics and Gynecology

## 2019-03-06 ENCOUNTER — Inpatient Hospital Stay (HOSPITAL_COMMUNITY)
Admission: AD | Admit: 2019-03-06 | Discharge: 2019-03-08 | DRG: 807 | Disposition: A | Discharge status: Home / Self Care | Payer: Medicaid Other | Attending: Family Medicine | Admitting: Family Medicine

## 2019-03-06 ENCOUNTER — Inpatient Hospital Stay (HOSPITAL_COMMUNITY): Payer: Medicaid Other

## 2019-03-06 DIAGNOSIS — Z3A37 37 weeks gestation of pregnancy: Secondary | ICD-10-CM

## 2019-03-06 DIAGNOSIS — O24420 Gestational diabetes mellitus in childbirth, diet controlled: Secondary | ICD-10-CM | POA: Diagnosis present

## 2019-03-06 DIAGNOSIS — O99824 Streptococcus B carrier state complicating childbirth: Secondary | ICD-10-CM | POA: Diagnosis present

## 2019-03-06 DIAGNOSIS — O1404 Mild to moderate pre-eclampsia, complicating childbirth: Principal | ICD-10-CM | POA: Diagnosis present

## 2019-03-06 DIAGNOSIS — O0990 Supervision of high risk pregnancy, unspecified, unspecified trimester: Secondary | ICD-10-CM

## 2019-03-06 DIAGNOSIS — Z3490 Encounter for supervision of normal pregnancy, unspecified, unspecified trimester: Secondary | ICD-10-CM

## 2019-03-06 DIAGNOSIS — O43123 Velamentous insertion of umbilical cord, third trimester: Secondary | ICD-10-CM | POA: Diagnosis present

## 2019-03-06 DIAGNOSIS — O1490 Unspecified pre-eclampsia, unspecified trimester: Secondary | ICD-10-CM | POA: Diagnosis present

## 2019-03-06 DIAGNOSIS — O43199 Other malformation of placenta, unspecified trimester: Secondary | ICD-10-CM

## 2019-03-06 LAB — COMPREHENSIVE METABOLIC PANEL
ALT: 21 U/L (ref 0–44)
AST: 31 U/L (ref 15–41)
Albumin: 2.8 g/dL — ABNORMAL LOW (ref 3.5–5.0)
Alkaline Phosphatase: 276 U/L — ABNORMAL HIGH (ref 38–126)
Anion gap: 10 (ref 5–15)
BUN: 7 mg/dL (ref 6–20)
CO2: 15 mmol/L — ABNORMAL LOW (ref 22–32)
Calcium: 9.2 mg/dL (ref 8.9–10.3)
Chloride: 112 mmol/L — ABNORMAL HIGH (ref 98–111)
Creatinine, Ser: 0.44 mg/dL (ref 0.44–1.00)
GFR calc Af Amer: >60 mL/min (ref >60)
GFR calc non Af Amer: >60 mL/min (ref >60)
Glucose, Bld: 106 mg/dL — ABNORMAL HIGH (ref 70–99)
Potassium: 3.7 mmol/L (ref 3.5–5.1)
Sodium: 137 mmol/L (ref 135–145)
Total Bilirubin: 0.3 mg/dL (ref 0.3–1.2)
Total Protein: 6.7 g/dL (ref 6.5–8.1)

## 2019-03-06 LAB — CBC
HCT: 33.9 % — ABNORMAL LOW (ref 36.0–46.0)
HCT: 32.2 % — ABNORMAL LOW (ref 36.0–46.0)
Hemoglobin: 11.3 g/dL — ABNORMAL LOW (ref 12.0–15.0)
Hemoglobin: 10.8 g/dL — ABNORMAL LOW (ref 12.0–15.0)
MCH: 28.2 pg (ref 26.0–34.0)
MCH: 28.3 pg (ref 26.0–34.0)
MCHC: 33.3 g/dL (ref 30.0–36.0)
MCHC: 33.5 g/dL (ref 30.0–36.0)
MCV: 84.5 fL (ref 80.0–100.0)
MCV: 84.5 fL (ref 80.0–100.0)
Platelets: 281 10*3/uL (ref 150–400)
Platelets: 262 10*3/uL (ref 150–400)
RBC: 4.01 MIL/uL (ref 3.87–5.11)
RBC: 3.81 MIL/uL — ABNORMAL LOW (ref 3.87–5.11)
RDW: 16.2 % — ABNORMAL HIGH (ref 11.5–15.5)
RDW: 16.2 % — ABNORMAL HIGH (ref 11.5–15.5)
WBC: 8.6 10*3/uL (ref 4.0–10.5)
WBC: 10.5 10*3/uL (ref 4.0–10.5)
nRBC: 0 % (ref 0.0–0.2)
nRBC: 0 % (ref 0.0–0.2)

## 2019-03-06 LAB — RPR: RPR Ser Ql: NONREACTIVE

## 2019-03-06 LAB — GLUCOSE, CAPILLARY
Glucose-Capillary: 62 mg/dL — ABNORMAL LOW (ref 70–99)
Glucose-Capillary: 68 mg/dL — ABNORMAL LOW (ref 70–99)
Glucose-Capillary: 85 mg/dL (ref 70–99)
Glucose-Capillary: 70 mg/dL (ref 70–99)

## 2019-03-06 LAB — TYPE AND SCREEN
ABO/RH(D): O POS
Antibody Screen: NEGATIVE

## 2019-03-06 LAB — ABO/RH: ABO/RH(D): O POS

## 2019-03-06 MED ORDER — ONDANSETRON HCL 4 MG/2ML IJ SOLN: 4.0000 mg | Freq: Four times a day (QID) | INTRAMUSCULAR | Status: DC | PRN

## 2019-03-06 MED ORDER — ACETAMINOPHEN 325 MG PO TABS: 650.0000 mg | ORAL_TABLET | ORAL | Status: DC | PRN

## 2019-03-06 MED ORDER — TERBUTALINE SULFATE 1 MG/ML IJ SOLN: 0.2500 mg | Freq: Once | INTRAMUSCULAR | Status: DC | PRN

## 2019-03-06 MED ORDER — OXYCODONE-ACETAMINOPHEN 5-325 MG PO TABS: 2.0000 | ORAL_TABLET | ORAL | Status: DC | PRN

## 2019-03-06 MED ORDER — LACTATED RINGERS IV SOLN: INTRAVENOUS | Status: DC

## 2019-03-06 MED ORDER — LACTATED RINGERS IV SOLN
500.0000 mL | Freq: Once | INTRAVENOUS | Status: AC
  Administered 2019-03-06: 500 mL via INTRAVENOUS

## 2019-03-06 MED ORDER — EPHEDRINE 5 MG/ML INJ: 10.0000 mg | INTRAVENOUS | Status: DC | PRN

## 2019-03-06 MED ORDER — PENICILLIN G POT IN DEXTROSE 60000 UNIT/ML IV SOLN
3.0000 10*6.[IU] | INTRAVENOUS | Status: DC
  Administered 2019-03-06 (×3): 3 10*6.[IU] via INTRAVENOUS
  Filled 2019-03-06 (×3): qty 50

## 2019-03-06 MED ORDER — LACTATED RINGERS IV SOLN: 500.0000 mL | INTRAVENOUS | Status: DC | PRN

## 2019-03-06 MED ORDER — OXYTOCIN 40 UNITS IN NORMAL SALINE INFUSION - SIMPLE MED
2.5000 [IU]/h | INTRAVENOUS | Status: DC
  Administered 2019-03-07: 2.5 [IU]/h via INTRAVENOUS
  Filled 2019-03-06: qty 1000

## 2019-03-06 MED ORDER — PHENYLEPHRINE 40 MCG/ML (10ML) SYRINGE FOR IV PUSH (FOR BLOOD PRESSURE SUPPORT): 80.0000 ug | PREFILLED_SYRINGE | INTRAVENOUS | Status: DC | PRN

## 2019-03-06 MED ORDER — SODIUM CHLORIDE (PF) 0.9 % IJ SOLN
INTRAMUSCULAR | Status: DC | PRN
  Administered 2019-03-06: 12 mL/h via EPIDURAL

## 2019-03-06 MED ORDER — OXYCODONE-ACETAMINOPHEN 5-325 MG PO TABS: 1.0000 | ORAL_TABLET | ORAL | Status: DC | PRN

## 2019-03-06 MED ORDER — MISOPROSTOL 25 MCG QUARTER TABLET
25.0000 ug | ORAL_TABLET | ORAL | Status: DC | PRN
  Administered 2019-03-06: 25 ug via VAGINAL
  Filled 2019-03-06 (×2): qty 1

## 2019-03-06 MED ORDER — DIPHENHYDRAMINE HCL 50 MG/ML IJ SOLN: 12.5000 mg | INTRAMUSCULAR | Status: DC | PRN

## 2019-03-06 MED ORDER — MISOPROSTOL 50MCG HALF TABLET
50.0000 ug | ORAL_TABLET | ORAL | Status: DC | PRN
  Administered 2019-03-06: 50 ug via BUCCAL
  Filled 2019-03-06: qty 1

## 2019-03-06 MED ORDER — TRANEXAMIC ACID-NACL 1000-0.7 MG/100ML-% IV SOLN
INTRAVENOUS | Status: AC
  Filled 2019-03-06: qty 100

## 2019-03-06 MED ORDER — FENTANYL-BUPIVACAINE-NACL 0.5-0.125-0.9 MG/250ML-% EP SOLN
12.0000 mL/h | EPIDURAL | Status: DC | PRN
  Filled 2019-03-06: qty 250

## 2019-03-06 MED ORDER — LIDOCAINE HCL (PF) 1 % IJ SOLN
30.0000 mL | INTRAMUSCULAR | Status: AC | PRN
  Administered 2019-03-06: 11 mL via SUBCUTANEOUS

## 2019-03-06 MED ORDER — SOD CITRATE-CITRIC ACID 500-334 MG/5ML PO SOLN: 30.0000 mL | ORAL | Status: DC | PRN

## 2019-03-06 MED ORDER — SODIUM CHLORIDE 0.9 % IV SOLN
5.0000 10*6.[IU] | Freq: Once | INTRAVENOUS | Status: AC
  Administered 2019-03-06: 5 10*6.[IU] via INTRAVENOUS
  Filled 2019-03-06: qty 5

## 2019-03-06 MED ORDER — OXYTOCIN BOLUS FROM INFUSION
500.0000 mL | Freq: Once | INTRAVENOUS | Status: AC
  Administered 2019-03-06: 500 mL via INTRAVENOUS

## 2019-03-06 NOTE — Anesthesia Procedure Notes (Signed)
Epidural Patient location during procedure: OB Start time: 03/06/2019 7:40 PM End time: 03/06/2019 7:54 PM  Staffing Anesthesiologist: Lynda Rainwater, MD Performed: anesthesiologist   Preanesthetic Checklist Completed: patient identified, IV checked, site marked, risks and benefits discussed, surgical consent, monitors and equipment checked, pre-op evaluation and timeout performed  Epidural Patient position: sitting Prep: ChloraPrep Patient monitoring: heart rate, cardiac monitor, continuous pulse ox and blood pressure Approach: midline Location: L2-L3 Injection technique: LOR saline  Needle:  Needle type: Tuohy  Needle gauge: 17 G Needle length: 9 cm Needle insertion depth: 4 cm Catheter type: closed end flexible Catheter size: 20 Guage Catheter at skin depth: 8 cm Test dose: negative  Assessment Events: blood not aspirated, injection not painful, no injection resistance, no paresthesia and negative IV test  Additional Notes Reason for block:procedure for pain

## 2019-03-06 NOTE — H&P (Signed)
OBSTETRIC ADMISSION HISTORY AND PHYSICAL  Laurie Mahoney is a 38 y.o. female G2P1001 with IUP at [redacted]w[redacted]d by 18w u/s presenting for IOL for preeclampsia without severe features. She reports +FMs, No LOF, no VB, no blurry vision, headaches or peripheral edema, and RUQ pain.  She plans on breast and bottle feeding. She request Depo for birth control. She received her prenatal care at [facility]   Dating: By 18 week u/s --->  Estimated Date of Delivery: 03/27/19  Sono:    @33wd , CWD, normal anatomy, cephalic presentation, [id number], 50% EFW   Prenatal History/Complications:  Past Medical History: Past Medical History:  Diagnosis Date  . Diabetes mellitus without complication ([place])   . Diet controlled gestational diabetes mellitus (GDM) 2012  . Gestational diabetes   . Medical history non-contributory     Past Surgical History: Past Surgical History:  Procedure Laterality Date  . Epidermal cyst excision of skin Right 2004  . NO PAST SURGERIES      Obstetrical History: OB History    Gravida  2   Para  1   Term  1   Preterm  0   AB  0   Living  1     SAB  0   TAB  0   Ectopic  0   Multiple      Live Births  1           Social History Social History   Socioeconomic History  . Marital status: Single    Spouse name: Not on file  . Number of children: 1  . Years of education: 36  . Highest education level: Not on file  Occupational History  . Not on file  Tobacco Use  . Smoking status: Never Smoker  . Smokeless tobacco: Never Used  Substance and Sexual Activity  . Alcohol use: Never  . Drug use: Never  . Sexual activity: Yes    Birth control/protection: Injection  Other Topics Concern  . Not on file  Social History Narrative   ** Merged History Encounter **       Came to [employer]. In 2003 Lives at home with her brother and her young daughter. Daughter stays with mom's friend during the day as out of school currently She is thinking about cutting  back on work hours to spend more time with her daughter.   Social Determinants of Health   Financial Resource Strain:   . Difficulty of Paying Living Expenses: Not on file  Food Insecurity:   . Worried About Charity fundraiser in the Last Year: Not on file  . Ran Out of Food in the Last Year: Not on file  Transportation Needs:   . Lack of Transportation (Medical): Not on file  . Lack of Transportation (Non-Medical): Not on file  Physical Activity:   . Days of Exercise per Week: Not on file  . Minutes of Exercise per Session: Not on file  Stress:   . Feeling of Stress : Not on file  Social Connections:   . Frequency of Communication with Friends and Family: Not on file  . Frequency of Social Gatherings with Friends and Family: Not on file  . Attends Religious Services: Not on file  . Active Member of Clubs or Organizations: Not on file  . Attends Archivist Meetings: Not on file  . Marital Status: Not on file    Family History: History reviewed. No pertinent family history.  Allergies: No Known Allergies  Medications  Prior to Admission  Medication Sig Dispense Refill Last Dose  . Elastic Bandages & Supports (COMFORT FIT MATERNITY SUPP LG) MISC 1 Units by Does not apply route daily. (Patient not taking: Reported on 02/24/2019) 1 each 0   . Ferrous Sulfate (IRON) 325 (65 Fe) MG TABS Take 1 tablet (325 mg total) by mouth every other day. 30 tablet 0   . glucose blood (ACCU-CHEK GUIDE) test strip Use as instructed 100 each 12   . Prenatal Vit-Fe Fumarate-FA (MULTIVITAMIN-PRENATAL) 27-0.8 MG TABS tablet Take 1 tablet by mouth daily at 12 noon.     . prenatal vitamin w/FE, FA (PRENATAL 1 + 1) 27-1 MG TABS tablet Take 1 tablet by mouth daily at 12 noon.        Review of Systems   All systems reviewed and negative except as stated in HPI  Blood pressure (!) 142/89, pulse 90, resp. rate 14, last menstrual period 05/31/2018. General appearance: alert, cooperative and no  distress Lungs: clear to auscultation bilaterally Heart: regular rate and rhythm Abdomen: soft, non-tender; bowel sounds normal Pelvic: n/a Extremities: Homans sign is negative, no sign of DVT DTR's +2 Presentation: cephalic- confirmed by bedside u/s Fetal monitoringBaseline: 145 bpm, Variability: Good {> 6 bpm), Accelerations: Reactive and Decelerations: Absent Uterine activityNone Dilation: 1 Effacement (%): Thick Station: -3 Exam by:: Haynes Bast, CNM   Prenatal labs: ABO, Rh: O/Positive/-- (06/24 0000) Antibody: Negative (06/24 0000) Rubella: Nonimmune (06/24 0000) RPR: Nonreactive (06/24 0000)  HBsAg:    HIV: Non-reactive (06/24 0000)  GBS: Positive/-- (12/17 1134)   Prenatal Transfer Tool  Maternal Diabetes: Yes:  Diabetes Type:  Diet controlled Genetic Screening: Abnormal:  Results: Elevated risk of Trisomy 21, Other: Quad abnormal, NIPS normal Maternal Ultrasounds/Referrals: Normal Fetal Ultrasounds or other Referrals:  Referred to Materal Fetal Medicine  Maternal Substance Abuse:  No Significant Maternal Medications:  None Significant Maternal Lab Results: Group B Strep positive  Results for orders placed or performed during the hospital encounter of 03/06/19 (from the past 24 hour(s))  CBC   Collection Time: 03/06/19  9:34 AM  Result Value Ref Range   WBC 8.6 4.0 - 10.5 K/uL   RBC 4.01 3.87 - 5.11 MIL/uL   Hemoglobin 11.3 (L) 12.0 - 15.0 g/dL   HCT 33.9 (L) 36.0 - 46.0 %   MCV 84.5 80.0 - 100.0 fL   MCH 28.2 26.0 - 34.0 pg   MCHC 33.3 30.0 - 36.0 g/dL   RDW 16.2 (H) 11.5 - 15.5 %   Platelets 281 150 - 400 K/uL   nRBC 0.0 0.0 - 0.2 %    Patient Active Problem List   Diagnosis Date Noted  . Pre-eclampsia 03/06/2019  . Encounter for elective induction of labor 03/06/2019  . Marginal insertion of umbilical cord affecting management of mother 03/06/2019  . Unstable lie of fetus 03/02/2019  . Language barrier 02/17/2019  . Mild pre-eclampsia 01/27/2019  .  Supervision of high risk pregnancy, antepartum 01/17/2019  . Advanced maternal age in multigravida 01/17/2019  . Abnormal quad screen 01/17/2019  . Rubella non-immune status, antepartum 01/17/2019  . Diet controlled gestational diabetes mellitus (GDM) 03/10/2010    Assessment/Plan:  Laurie Mahoney is a 38 y.o. G2P1001 at [redacted]w[redacted]d here for IOL for preeclampsia without severe features. A1GDM, AMA, marginal cord insertion  #Labor: cyotec, FB if needed when able #Pain: Planning on epidural #FWB: Cat 1 #ID:  GBS pos, PCN #MOF: both #MOC: Depo #Circ:  n/a  Wende Mott, CNM  03/06/2019, 9:52 AM

## 2019-03-06 NOTE — Progress Notes (Signed)
Laurie Mahoney is a 38 y.o. G2P1001 at [redacted]w[redacted]d admitted for  IOL for preeclampsia without severe features  Subjective: Not able to feel contractions, comfortable with epidural. Some pressure in her bottom.  Objective: BP (!) 142/71   Pulse 77   Temp 98.2 F (36.8 C) (Oral)   Resp 16   Ht 5\' 1"  (1.549 m)   Wt 68.7 kg   LMP 05/31/2018 (Approximate)   SpO2 100%   BMI 28.63 kg/m  Total I/O In: -  Out: 450 [Urine:450]  FHT:  FHR: 130 bpm, variability: moderate,  accelerations:  Present,  decelerations:  Present early UC:   regular, every 4-5 minutes  SVE:   Dilation: 10 Effacement (%): 100 Station: Plus 2 Exam by:: Dr. Darene Lamer  Labs: Lab Results  Component Value Date   WBC 10.5 03/06/2019   HGB 10.8 (L) 03/06/2019   HCT 32.2 (L) 03/06/2019   MCV 84.5 03/06/2019   PLT 262 03/06/2019    Assessment / Plan: 38 y.o. G2P1001 [redacted]w[redacted]d IOL preeclampsia without severe features  #Labor: Progressing well. S/p cytotec and AROM. Did several pushes, without movement of the fetal station. Patient unable to feel contractions and unable to feel where she should push. Plan to labor down for one hour, then recheck.  #Pain: comfortable with epidural  #FWB: Cat 1  #GBS positive: PCN  Anticipate vaginal delivery  Dan Europe Leitha Hyppolite DO OB Fellow, Faculty Practice 03/06/2019, 10:18 PM

## 2019-03-06 NOTE — Discharge Summary (Addendum)
Postpartum Discharge Summary       Patient Name: Laurie Mahoney DOB: 1980/10/15 MRN: [id number]  Date of admission: 03/06/2019 Delivering Provider: Merilyn Baba   Date of discharge: 03/08/2019  Admitting diagnosis: Pre-eclampsia [O14.90] Intrauterine pregnancy: [redacted]w[redacted]d    Secondary diagnosis:  Active Problems:   Pre-eclampsia   Encounter for elective induction of labor   Marginal insertion of umbilical cord affecting management of mother  Additional problems: A1GDM, Pre-E w/o SF     Discharge diagnosis: Term Pregnancy Delivered                                                                                                Post partum procedures: deop  Augmentation: AROM and Cytotec  Complications: None  Hospital course:  Induction of Labor With Vaginal Delivery   38y.o. yo G2P1001 at 382w0das admitted to the hospital 03/06/2019 for induction of labor.  Indication for induction: Preeclampsia and A1 DM.  Patient had an uncomplicated labor course as follows:  Patient was admitted for induction. She received 2 doses of cytotec, and was AROMed with clear fluid. She progressed without pitocin.  Membrane Rupture Time/Date: 6:06 PM ,03/06/2019   Intrapartum Procedures: Episiotomy: None [1]                                         Lacerations:  1st degree [2]  Patient had delivery of a Viable infant.  Information for the patient's newborn:  HuRead Drivers0[[id number]]Delivery Method: Vag-Spont   03/06/2019  Details of delivery can be found in separate delivery note.  Patient had a routine postpartum course. Patient is discharged home 03/09/19. Delivery time: 11:22 PM    Magnesium Sulfate received: No BMZ received: No Rhophylac:N/A MMR:N/A Transfusion:No  Physical exam  Vitals:   03/07/19 1325 03/07/19 2102 03/08/19 0616 03/08/19 1442  BP: 122/65 116/79 127/82 137/77  Pulse: 77 74 70 82  Resp: _0 Temp: 98 F (36.7 C)  (!) 97.3 F (36.3 C) 98 F (36.7 C) 98 F (36.7 C)  TempSrc: Oral Oral Oral Oral  SpO2: 100%   100%  Weight:      Height:       General: alert, cooperative and no distress Lochia: appropriate Uterine Fundus: firm Incision: N/A DVT Evaluation: No evidence of DVT seen on physical exam. Negative Homan's sign. No cords or calf tenderness. Labs: Lab Results  Component Value Date   WBC 11.0 (H) 03/07/2019   HGB 9.9 (L) 03/07/2019   HCT 29.5 (L) 03/07/2019   MCV 82.9 03/07/2019   PLT 257 03/07/2019   CMP Latest Ref Rng & Units 03/06/2019  Glucose 70 - 99 mg/dL 106(H)  BUN 6 - 20 mg/dL 7  Creatinine 0.44 - 1.00 mg/dL 0.44  Sodium 135 - 145 mmol/L 137  Potassium 3.5 - 5.1 mmol/L 3.7  Chloride 98 - 111 mmol/L 112(H)  CO2 22 - 32 mmol/L 15(L)  Calcium 8.9 - 10.3 mg/dL  9.2  Total Protein 6.5 - 8.1 g/dL 6.7  Total Bilirubin 0.3 - 1.2 mg/dL 0.3  Alkaline Phos 38 - 126 U/L 276(H)  AST 15 - 41 U/L 31  ALT 0 - 44 U/L 21    Discharge instruction: per After Visit Summary and "Baby and Me Booklet".  After visit meds:  Allergies as of 03/08/2019   No Known Allergies      Medication List     STOP taking these medications    Accu-Chek Guide test strip Generic drug: glucose blood   Comfort Fit Maternity Supp Lg Misc       TAKE these medications    ibuprofen 600 MG tablet Commonly known as: ADVIL Take 1 tablet (600 mg total) by mouth every 6 (six) hours.   Iron 325 (65 Fe) MG Tabs Take 1 tablet (325 mg total) by mouth every other day.   multivitamin-prenatal 27-0.8 MG Tabs tablet Take 1 tablet by mouth daily at 12 noon. What changed: Another medication with the same name was removed. Continue taking this medication, and follow the directions you see here.        Diet: routine diet  Activity: Advance as tolerated. Pelvic rest for 6 weeks.   Outpatient follow up:4 weeks Follow up Appt: Future Appointments  Date Time Provider [place]  04/08/2019   9:35 AM Truett Mainland, DO WOC-WOCA [place]  04/20/2019  8:50 AM WOC-WOCA LAB [facility]A [facility]   Follow up Visit: [place] for [facility]. Schedule an appointment as soon as possible for a visit on 03/15/2019.   Specialty: Obstetrics and Gynecology Why: for blood pressure check Contact information: [address] [place], [place] [id number] [place] [address] [phone]          Please schedule this patient for Postpartum visit in: 4 weeks with the following provider: Any provider For C/S patients schedule nurse incision check in weeks 2 weeks: no High risk pregnancy complicated by: Pre-E w/o SF, A1GDM Delivery mode:  SVD Anticipated Birth Control:  Depo PP Procedures needed: 2 hour GTT  Schedule Integrated BH visit: no  Newborn Data: Live born female  APGAR: 48, 77  Newborn Delivery   Birth date/time: 03/06/2019 23:22:00 Delivery type: Vaginal, Spontaneous      Baby Feeding: Bottle and Breast Disposition:home with mother   03/09/2019 Christin Fudge, CNM

## 2019-03-06 NOTE — Discharge Instructions (Signed)
Parto vaginal, cuidados de puerperio Postpartum Care After Vaginal Delivery Lea esta informacin sobre cmo cuidarse desde el momento en que nazca su beb y Waylan Boga 6 a 12 semanas despus del parto (perodo del posparto). El mdico tambin podr darle instrucciones ms especficas. Comunquese con su mdico si tiene problemas o preguntas. Siga estas indicaciones en su casa: Hemorragia vaginal  Es normal tener un poco de hemorragia vaginal (loquios) despus del parto. Use un apsito sanitario para el sangrado vaginal y secrecin. ? Durante la primera semana despus del parto, la cantidad y el aspecto de los loquios a menudo es similar a las del perodo menstrual. ? Durante las siguientes semanas disminuir gradualmente hasta convertirse en una secrecin seca amarronada o [place]. ? En la [employer], los loquios se detienen [employer] 4 a 6semanas despus del [place]. Los sangrados vaginales pueden variar de mujer a Art therapist.  Cambie los apsitos sanitarios con frecuencia. Observe si hay cambios en el flujo, como: ? Un aumento repentino en el volumen. ? Cambio en el color. ? Cogulos sanguneos grandes.  Si expulsa un cogulo de sangre por la vagina, gurdelo y llame al mdico para informrselo. No deseche los cogulos de sangre por el inodoro antes de hablar con su mdico.  No use tampones ni se haga duchas vaginales hasta que el mdico la autorice.  Si no est amamantando, volver a tener su perodo entre 6 y 34 semanas despus del parto. Si solamente alimenta al beb con Steva Colder materna (lactancia materna exclusiva), podra no volver a tener su perodo hasta que deje de [place]. Cuidados perineales  Mantenga la zona entre la vagina y el ano (perineo) limpia y [place], [place] se lo haya indicado el mdico. Utilice apsitos o aerosoles analgsicos y Proofreader, como se lo hayan indicado.  Si le hicieron un corte en el perineo (episiotoma) o tuvo un desgarro en la vagina, controle la  zona para detectar signos de infeccin hasta que sane. Est atenta a los siguientes signos: ? Aumento del enrojecimiento, la hinchazn o Conservation officer, historic buildings. ? Presenta lquido o sangre que supura del corte o Tree surgeon. ? Calor. ? Pus o mal olor.  Es posible que le den una botella rociadora para que use en lugar de limpiarse el rea con papel higinico despus de usar el bao. Cuando comience a Scientist, research (medical), podr usar la botella rociadora antes de secarse. Asegrese de secarse suavemente.  Para aliviar el dolor causado por una episiotoma, un desgarro en la vagina o venas hinchadas en el ano (hemorroides), trate de tomar un bao de asiento tibio 2 o 3 veces por da. Un bao de asiento es un bao de agua tibia que se toma mientras se est sentado. El agua solo debe Systems analyst las caderas y cubrir las nalgas. [place] despus del parto, las mamas pueden sentirse pesadas, llenas e incmodas (congestin [place]). Tambin puede escaparse leche de sus senos. El mdico puede sugerirle mtodos para Gaffer. La congestin mamaria debera desaparecer al cabo de [employer].  Si est amamantando: ? Use un sostn que sujete y ajuste bien sus pechos. ? Mantenga los pezones secos y limpios. Aplquese cremas y ungentos, como se lo haya indicado el mdico. ? Es posible que deba usar discos de algodn en el sostn para Tax adviser la [place] que se filtre de sus senos. ? Puede tener contracciones uterinas cada vez que amamante durante varias semanas despus del [place]. Las contracciones uterinas ayudan al tero a  regresar a su tamao habitual. ? Si tiene algn problema con la lactancia materna, colabore con el mdico o un Lobbyist.  Si no est amamantando: ? Evite tocarse [employer]. Al hacerlo, podran producir ms leche. ? Use un sostn que le proporcione el ajuste correcto y compresas fras para reducir la hinchazn. ? No extraiga (saque) [employer]. Esto har que  produzca ms [employer]. Intimidad y sexualidad  Pregntele al mdico cundo puede retomar la actividad sexual. Esto puede depender de lo siguiente: ? Su riesgo de sufrir infecciones. ? La rapidez con la que est sanando. ? Su comodidad y deseo de retomar la actividad sexual.  Despus del parto, puede quedar embarazada incluso si no ha tenido todava su perodo. Si lo desea, hable con el mdico acerca de los mtodos de control de la natalidad (mtodos anticonceptivos). Medicamentos  [employer] de venta libre y los recetados solamente como se lo haya indicado el mdico.  Si le recetaron un antibitico, tmelo como se lo haya indicado el mdico. No deje de tomar el antibitico aunque comience a sentirse mejor. Actividad  Retome sus actividades normales de a poco como se lo haya indicado el mdico. Pregntele al mdico qu actividades son seguras para usted.  Descanse todo lo que pueda. Trate de descansar o tomar una siesta mientras el beb duerme. Comida y bebida   Beba suficiente lquido como para Theatre manager la orina de color amarillo plido.  Coma alimentos ricos en [employer]. Estos pueden ayudarla a prevenir o Cytogeneticist. Los alimentos ricos en fibras incluyen, entre otros: ? Panes y cereales integrales. ? Arroz integral. ? Optometrist. ? Lambert Mody y verduras frescas.  No intente perder de peso rpidamente reduciendo el consumo de caloras.  Tome sus vitaminas prenatales hasta la visita de seguimiento de posparto o hasta que su mdico le indique que puede dejar de tomarlas. Estilo de vida  No consuma ningn producto que contenga nicotina o tabaco, como cigarrillos y Psychologist, sport and exercise. Si necesita ayuda para dejar de fumar, consulte al mdico.  No beba alcohol, especialmente si est amamantando. Instrucciones generales  Concurra a todas las visitas de seguimiento para usted y el beb, como se lo haya indicado el mdico. La mayora de las mujeres  visita al mdico para un seguimiento de posparto dentro de las primeras 3 a 6 semanas despus del parto. Comunquese con un mdico si:  Se siente incapaz de controlar los cambios que implica tener un hijo y esos sentimientos no desaparecen.  Siente tristeza o preocupacin de forma [place] mamas se ponen rojas, le duelen o se endurecen.  Tiene fiebre.  Tiene dificultad para retener la [place] o para impedir que la orina se escape.  Tiene poco inters o falta de inters en actividades que solan gustarle.  No ha amamantado nada y no ha tenido un perodo menstrual durante 12 semanas despus del [place].  Dej de amamantar al beb y no ha tenido su perodo menstrual durante 12 semanas despus de dejar de Economist.  Tiene preguntas sobre su cuidado y el del beb.  Elimina un cogulo de sangre grande por la vagina. Solicite ayuda de inmediato si:  Electronics engineer.  Tiene dificultad para respirar.  Tiene un dolor repentino e intenso en la pierna.  Tiene dolor intenso o clicos en el la parte inferior del abdomen.  Tiene una hemorragia tan intensa de la vagina que empapa ms de un apsito en [employer]. El sangrado  no debe ser ms abundante que el perodo ms intenso que haya tenido.  Dolor de cabeza intenso.  Se desmaya.  Tiene visin borrosa o Geologist, engineering.  Tiene secrecin vaginal con mal olor.  Tiene pensamientos acerca de lastimarse a usted misma o a su beb. Si alguna vez siente que puede lastimarse a usted misma o a Producer, television/film/video, o tiene pensamientos de poner fin a su vida, busque ayuda de inmediato. Puede dirigirse al departamento de emergencias ms cercano o llamar a:  El servicio de emergencias de su localidad (911 en [place].).  Una lnea de asistencia al suicida y Freight forwarder en crisis, como la [employer] de Prevencin del Suicidio (National Suicide Prevention Lifeline), al [phone]. Est disponible las 24 horas del da. Resumen  El  perodo de tiempo justo despus el parto y Waylan Boga 11 a 12 semanas despus del parto se denomina perodo posparto.  Retome sus actividades normales de a Theme park manager se lo haya indicado el mdico.  Concurra a todas las visitas de seguimiento para usted y Photographer beb, como se lo haya indicado el mdico. Esta informacin no tiene Marine scientist el consejo del mdico. Asegrese de hacerle al mdico cualquier pregunta que tenga. Document Released: 12/22/2006 Document Revised: 06/06/2017 Document Reviewed: 02/15/2017 Elsevier Patient Education  2020 [employer].

## 2019-03-06 NOTE — Progress Notes (Signed)
Labor Progress Note Laurie Mahoney is a 38 y.o. G2P1001 at [redacted]w[redacted]d presented for IOL for preeclampsia without severe features  S:  Patient comfortable. Denies HA, visual changes and epigastric pain  O:  BP 132/77   Pulse 81   Temp 98.3 F (36.8 C) (Oral)   Resp 14   Ht 5\' 1"  (1.549 m)   Wt 68.7 kg   LMP 05/31/2018 (Approximate)   BMI 28.63 kg/m   Fetal Tracing:  Baseline: 145 Variability: moderate Accels: 15x15 Decels: none  Toco: 1-2   CVE: Dilation: 3 Effacement (%): 60 Station: -2 Presentation: Vertex Exam by:: Haynes Bast, CNM   A&P: 38 y.o. G2P1001 [redacted]w[redacted]d IOL preeclampsia without severe features #Labor: Progressing well. Discussed with patient risks and benefits of AROM for augmentation of labor. Patient agreeable to plan of care. AROM with moderate amount of clear fluid. Patient and FHR tolerated procedure well. Patient contracting every 1-2 minutes. Will hold pit at this time and start if contractions space out.  #Pain: planning epidural #FWB: Cat 1 #GBS positive  Wende Mott, CNM 6:11 PM

## 2019-03-06 NOTE — Progress Notes (Signed)
[place] [phone] used for admission. Patient speaks some english and was instructed that she can request an interpreter at anytime she felt it was needed.  Patient verbalized understanding.

## 2019-03-06 NOTE — Anesthesia Preprocedure Evaluation (Signed)
Anesthesia Evaluation  Patient identified by MRN, date of birth, ID band Patient awake    Reviewed: Allergy & Precautions, NPO status , Patient's Chart, lab work & pertinent test results  Airway Mallampati: II  TM Distance: >3 FB Neck ROM: Full    Dental no notable dental hx.    Pulmonary neg pulmonary ROS,    Pulmonary exam normal breath sounds clear to auscultation       Cardiovascular hypertension, Normal cardiovascular exam Rhythm:Regular Rate:Normal     Neuro/Psych negative neurological ROS  negative psych ROS   GI/Hepatic negative GI ROS, Neg liver ROS,   Endo/Other  negative endocrine ROSdiabetes, Gestational  Renal/GU negative Renal ROS  negative genitourinary   Musculoskeletal negative musculoskeletal ROS (+)   Abdominal   Peds negative pediatric ROS (+)  Hematology negative hematology ROS (+)   Anesthesia Other Findings   Reproductive/Obstetrics (+) Pregnancy                             Anesthesia Physical Anesthesia Plan  ASA: III  Anesthesia Plan: Epidural   Post-op Pain Management:    Induction:   PONV Risk Score and Plan:   Airway Management Planned:   Additional Equipment:   Intra-op Plan:   Post-operative Plan:   Informed Consent:   Plan Discussed with:   Anesthesia Plan Comments:         Anesthesia Quick Evaluation

## 2019-03-06 NOTE — Progress Notes (Signed)
Labor Progress Note Talene Piccione is a 38 y.o. G2P1001 at [redacted]w[redacted]d presented for IOL preeclampsia without severe features  S:  Patient comfortable. Denies HA, visual changes or epigastric pain  O:  BP 130/75   Pulse 72   Temp 98.3 F (36.8 C) (Oral)   Resp 16   Ht 5\' 1"  (1.549 m)   Wt 68.7 kg   LMP 05/31/2018 (Approximate)   BMI 28.63 kg/m   Fetal Tracing:  Baseline: 140 Variability: moderate Accels: 15x15 Decels: none  Toco: occasional    CVE: Dilation: 1.5 Effacement (%): 50 Station: -3 Presentation: Vertex Exam by:: Ignacia Felling, RN   A&P: 38 y.o. G2P1001 [redacted]w[redacted]d IOL preeclampsia without severe features #Labor: Minimal change in exam. Unable to AROM due to fetal station. Will give another dose of cytotec #Pain: per patient request #FWB: Cat 1 #GBS positive  Wende Mott, CNM 2:42 PM

## 2019-03-07 ENCOUNTER — Encounter (HOSPITAL_COMMUNITY): Payer: Self-pay | Admitting: Obstetrics and Gynecology

## 2019-03-07 LAB — CBC
HCT: 29.5 % — ABNORMAL LOW (ref 36.0–46.0)
Hemoglobin: 9.9 g/dL — ABNORMAL LOW (ref 12.0–15.0)
MCH: 27.8 pg (ref 26.0–34.0)
MCHC: 33.6 g/dL (ref 30.0–36.0)
MCV: 82.9 fL (ref 80.0–100.0)
Platelets: 257 10*3/uL (ref 150–400)
RBC: 3.56 MIL/uL — ABNORMAL LOW (ref 3.87–5.11)
RDW: 16 % — ABNORMAL HIGH (ref 11.5–15.5)
WBC: 11 10*3/uL — ABNORMAL HIGH (ref 4.0–10.5)
nRBC: 0 % (ref 0.0–0.2)

## 2019-03-07 LAB — GLUCOSE, CAPILLARY: Glucose-Capillary: 85 mg/dL (ref 70–99)

## 2019-03-07 MED ORDER — BENZOCAINE-MENTHOL 20-0.5 % EX AERO
1.0000 "application " | INHALATION_SPRAY | CUTANEOUS | Status: DC | PRN
  Filled 2019-03-07: qty 56

## 2019-03-07 MED ORDER — WITCH HAZEL-GLYCERIN EX PADS: 1.0000 "application " | MEDICATED_PAD | CUTANEOUS | Status: DC | PRN

## 2019-03-07 MED ORDER — OXYCODONE HCL 5 MG PO TABS: 10.0000 mg | ORAL_TABLET | ORAL | Status: DC | PRN

## 2019-03-07 MED ORDER — ZOLPIDEM TARTRATE 5 MG PO TABS: 5.0000 mg | ORAL_TABLET | Freq: Every evening | ORAL | Status: DC | PRN

## 2019-03-07 MED ORDER — DIPHENHYDRAMINE HCL 25 MG PO CAPS: 25.0000 mg | ORAL_CAPSULE | Freq: Four times a day (QID) | ORAL | Status: DC | PRN

## 2019-03-07 MED ORDER — SENNOSIDES-DOCUSATE SODIUM 8.6-50 MG PO TABS
2.0000 | ORAL_TABLET | ORAL | Status: DC
  Administered 2019-03-07: 2 via ORAL
  Filled 2019-03-07 (×2): qty 2

## 2019-03-07 MED ORDER — TETANUS-DIPHTH-ACELL PERTUSSIS 5-2.5-18.5 LF-MCG/0.5 IM SUSP: 0.5000 mL | Freq: Once | INTRAMUSCULAR | Status: DC

## 2019-03-07 MED ORDER — OXYCODONE HCL 5 MG PO TABS: 5.0000 mg | ORAL_TABLET | ORAL | Status: DC | PRN

## 2019-03-07 MED ORDER — COCONUT OIL OIL: 1.0000 "application " | TOPICAL_OIL | Status: DC | PRN

## 2019-03-07 MED ORDER — SIMETHICONE 80 MG PO CHEW
80.0000 mg | CHEWABLE_TABLET | ORAL | Status: DC | PRN
  Filled 2019-03-07: qty 1

## 2019-03-07 MED ORDER — ONDANSETRON HCL 4 MG PO TABS: 4.0000 mg | ORAL_TABLET | ORAL | Status: DC | PRN

## 2019-03-07 MED ORDER — ONDANSETRON HCL 4 MG/2ML IJ SOLN: 4.0000 mg | INTRAMUSCULAR | Status: DC | PRN

## 2019-03-07 MED ORDER — PRENATAL MULTIVITAMIN CH
1.0000 | ORAL_TABLET | Freq: Every day | ORAL | Status: DC
  Administered 2019-03-07 – 2019-03-08 (×2): 1 via ORAL
  Filled 2019-03-07 (×2): qty 1

## 2019-03-07 MED ORDER — DIBUCAINE (PERIANAL) 1 % EX OINT: 1.0000 "application " | TOPICAL_OINTMENT | CUTANEOUS | Status: DC | PRN

## 2019-03-07 MED ORDER — ACETAMINOPHEN 325 MG PO TABS: 650.0000 mg | ORAL_TABLET | ORAL | Status: DC | PRN

## 2019-03-07 MED ORDER — IBUPROFEN 600 MG PO TABS
600.0000 mg | ORAL_TABLET | Freq: Four times a day (QID) | ORAL | Status: DC
  Administered 2019-03-07 – 2019-03-08 (×6): 600 mg via ORAL
  Filled 2019-03-07 (×6): qty 1

## 2019-03-07 MED ORDER — MEDROXYPROGESTERONE ACETATE 150 MG/ML IM SUSP
150.0000 mg | Freq: Once | INTRAMUSCULAR | Status: DC
  Filled 2019-03-07: qty 1

## 2019-03-07 NOTE — Progress Notes (Signed)
Ordered meals by Juliann Mule Spanish Interpreter.

## 2019-03-07 NOTE — Lactation Note (Signed)
This note was copied from a baby's chart. Lactation Consultation Note  Patient Name: Laurie Mahoney [id number] Date: 03/07/2019 Reason for consult: Follow-up assessment;Early term 37-38.6wks;Hyperbilirubinemia  Interpretor: Angelica Chessman. I followed up with Ms. [employer]. Baby Laurie Mahoney had just begun single phototherapy (bili blanket). She was in her bassinet, and she was exhibiting feeding cues. Ms. Jerilee Hoh reports that she tried to feed baby about 45 minutes ago, but she would not take the bottle.  Ms. Jerilee Hoh is breast feeding, but she states that baby becomes impatient at the breast and cries. She plans to continue to offer the breast and do lots of lick and learn time. She is also pumping, and she had about 2-3 mls of EBM prepared to bottle feed.   I assisted with helping her provide her EBM, first by bottle and then the remainder by spoon. She then supplemented with Sim 22. I recommended approximately 10 mls for this 88 hour old.  Currently, baby is taking a purple slow flow nipple. She seemed to do well with this nipple.  Ms. Jerilee Hoh understands the recommendations to DEBP for 15 minutes q 3 hours and to offer baby lots of opportunities to latch. She has breast fed her previous children, and she plans to breast feed Laurie Mahoney. She remarks that baby may need a little more time to become effective at transferring. She plans to stay with this protocol tonight.  Feeding Feeding Type: Breast Milk with Formula added  Interventions Interventions: Breast feeding basics reviewed  Lactation Tools Discussed/Used Tools: Bottle   Consult Status Consult Status: Follow-up Date: 03/08/19 Follow-up type: In-patient    Lenore Manner 03/07/2019, 7:14 PM

## 2019-03-07 NOTE — Lactation Note (Signed)
This note was copied from a baby's chart. Lactation Consultation Note  Patient Name: Laurie Mahoney [id number] Date: 03/07/2019 Reason for consult: Initial assessment;Early term 37-38.6wks;Infant < 6lbs P2, 4 hour female infant, ETI, DAT+ Spanish Interpreter used Olegario Shearer [phone] Tools given : Hand pump and DEBP Mom's feeding choice at admission was breast and formula feeding. Mom is experienced at breastfeeding she breastfeed her 37 year old son for 5 months. Mom is active on the [facility] program in [facility] and she has a DEBP at home. Per mom, infant breastfeed maybe 2 minutes in L&D. Mom is offering formula she  was concern she did not have breast milk to give infant. Mom had given infant 10 mls of formula prior to [facility] entering the room.  While LC was discussing hand expression and mom taught back,  infant started cuing. Mom was willing to latch infant, mom latched infant on left breast using the cross cradle hold, infant latched and sustained latch, breastfeeding for  approximately 7 minutes. Mom was doing STS after breastfeeding infant.  Mom plans to breast infant according hunger cues, 8 to 12 times within 24 hours and on demand, mom will not exceed 3 hours without breastfeeding infant. Mom knows to call RN or LC if she has any questions, concerns or need assistance with latching infant at breast.  Mom shown how to use DEBP & how to disassemble, clean, & reassemble parts. Reviewed Baby & Me book's Breastfeeding Basics.  Mom made aware of O/P services, breastfeeding support groups, community resources, and our phone # for post-discharge questions.  Mom's current feeding plan: 1. Mom will breastfeeding infant according feeding cues, 8 to 12 times within 24 hours. 2. Mom will supplement with EBM/ and or formula  After latching infant at  breast according infant age/ hours of life.  3. Mom will use DEBP every 3 hours pumping both breast  for 15 minutes on initial setting.   Maternal Data Formula Feeding for Exclusion: Yes Reason for exclusion: Mother's choice to formula and breast feed on admission Has patient been taught Hand Expression?: Yes Does the patient have breastfeeding experience prior to this delivery?: Yes  Feeding Feeding Type: Breast Fed  LATCH Score Latch: Grasps breast easily, tongue down, lips flanged, rhythmical sucking.  Audible Swallowing: A few with stimulation  Type of Nipple: Everted at rest and after stimulation  Comfort (Breast/Nipple): Soft / non-tender  Hold (Positioning): Assistance needed to correctly position infant at breast and maintain latch.  LATCH Score: 8  Interventions Interventions: Breast feeding basics reviewed;Breast compression;Assisted with latch;Adjust position;Skin to skin;Support pillows;DEBP;Hand pump;Breast massage;Position options;Hand express;Expressed milk;Pre-pump if needed  Lactation Tools Discussed/Used Tools: Pump Breast pump type: Double-Electric Breast Pump;Manual WIC Program: Yes Pump Review: Setup, frequency, and cleaning;Milk Storage Initiated by:: Vicente Serene, IBCLC Date initiated:: 03/07/19   Consult Status Consult Status: Follow-up Date: 03/07/19 Follow-up type: In-patient    Vicente Serene 03/07/2019, 3:50 AM

## 2019-03-07 NOTE — Progress Notes (Signed)
POSTPARTUM PROGRESS NOTE  Subjective: Laurie Mahoney is a 38 y.o. [id number] PPD#1 s/p vaginal delivery at [redacted]w[redacted]d.  She reports she doing well. No acute events overnight. She denies any problems with ambulating, voiding or po intake. Denies nausea or vomiting. She has passed flatus. Pain is well controlled.  Lochia is appropriate.  Objective: Blood pressure 135/81, pulse 81, temperature 98.3 F (36.8 C), temperature source Oral, resp. rate 18, height 5\' 1"  (1.549 m), weight 68.7 kg, last menstrual period 05/31/2018, SpO2 99 %, unknown if currently breastfeeding.  Physical Exam:  General: alert, cooperative and no distress Chest: no respiratory distress Abdomen: soft, non-tender  Uterine Fundus: firm, appropriately tender Extremities: No calf swelling or tenderness  No edema  Recent Labs    03/06/19 1808 03/07/19 0550  HGB 10.8* 9.9*  HCT 32.2* 29.5*    Assessment/Plan: Laurie Mahoney is a 38 y.o. [id number] PPD#1 s/p at [redacted]w[redacted]d.  Routine Postpartum Care: Doing well, pain well-controlled.  -- Continue routine care, lactation support  -- Contraception: depo -- Feeding: bottle  Dispo: Plan for discharge tomorrow.  Laurie Baba, DO OB/GYN Fellow, [facility] for [facility]

## 2019-03-07 NOTE — Anesthesia Postprocedure Evaluation (Signed)
Anesthesia Post Note  Patient: Laurie Mahoney  Procedure(s) Performed: AN AD Re[place]     Patient location during evaluation: Mother Baby Anesthesia Type: Epidural Level of consciousness: awake and alert Pain management: pain level controlled Vital Signs Assessment: post-procedure vital signs reviewed and stable Respiratory status: spontaneous breathing, nonlabored ventilation and respiratory function stable Cardiovascular status: stable Postop Assessment: no headache, no backache and epidural receding Anesthetic complications: no    Last Vitals:  Vitals:   03/07/19 0207 03/07/19 0520  BP: 133/68 135/81  Pulse: 73 81  Resp: 18 18  Temp: 36.6 C 36.8 C  SpO2: 100% 99%    Last Pain:  Vitals:   03/07/19 0520  TempSrc: Oral  PainSc:    Pain Goal:                   Tyjanae Bartek

## 2019-03-08 MED ORDER — MEDROXYPROGESTERONE ACETATE 150 MG/ML IM SUSP: 150.0000 mg | Freq: Once | INTRAMUSCULAR | Status: DC

## 2019-03-08 MED ORDER — IBUPROFEN 600 MG PO TABS: 600.0000 mg | ORAL_TABLET | Freq: Four times a day (QID) | ORAL | 0 refills | Status: AC

## 2019-03-08 NOTE — Lactation Note (Signed)
This note was copied from a baby's chart. Lactation Consultation Note  Patient Name: Laurie Mahoney [id number] Date: 03/08/2019 Reason for consult: Follow-up assessment  In house interpreter present for consult. Mom is both breast and formula feeding by choice.  She states baby gets frustrated at the breast.  She supplements with formula.  Discussed milk coming to volume and the treatment of engorgement.  Mom has a pump for prn use.  Recommended discontinuing formula when milk comes in but she said she wants to do both.  Questions answered.  Encouraged to call prn.  Maternal Data    Feeding Feeding Type: Bottle Fed - Formula Nipple Type: Extra Slow Flow  LATCH Score                   Interventions    Lactation Tools Discussed/Used     Consult Status Consult Status: Complete Follow-up type: Call as needed    Ave Filter 03/08/2019, 2:37 PM

## 2019-03-09 ENCOUNTER — Ambulatory Visit (HOSPITAL_COMMUNITY): Payer: Self-pay

## 2019-03-09 ENCOUNTER — Encounter (HOSPITAL_COMMUNITY): Payer: Self-pay

## 2019-04-08 ENCOUNTER — Encounter: Payer: Self-pay | Admitting: Family Medicine

## 2019-04-08 ENCOUNTER — Ambulatory Visit: Payer: Self-pay | Admitting: Family Medicine

## 2019-04-19 ENCOUNTER — Other Ambulatory Visit: Payer: Self-pay | Admitting: *Deleted

## 2019-04-19 DIAGNOSIS — Z8632 Personal history of gestational diabetes: Secondary | ICD-10-CM

## 2019-04-20 ENCOUNTER — Other Ambulatory Visit: Payer: Self-pay

## 2020-01-02 IMAGING — US US FETAL BPP W/ NON-STRESS
1 series · 13 of 13 positions shown · non-contrast
Comparison: none

[Series 1: us fetal bpp w/nonstress · 13 acquisitions, 13 frames shown]
[im 1/13]
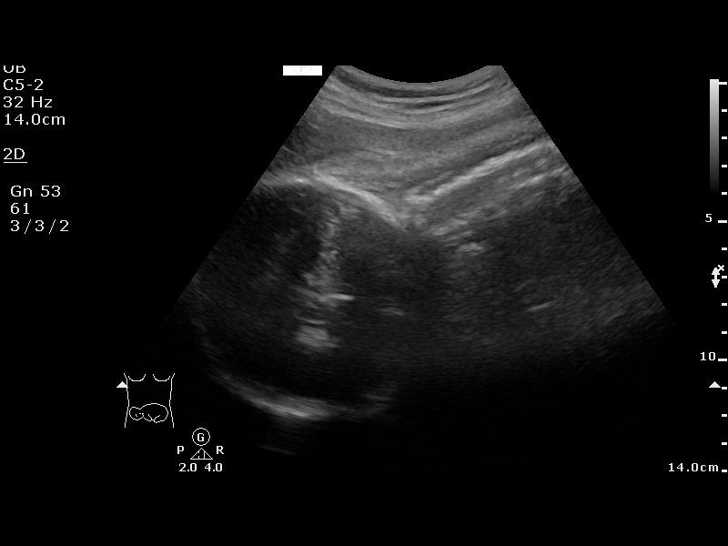
[im 2/13]
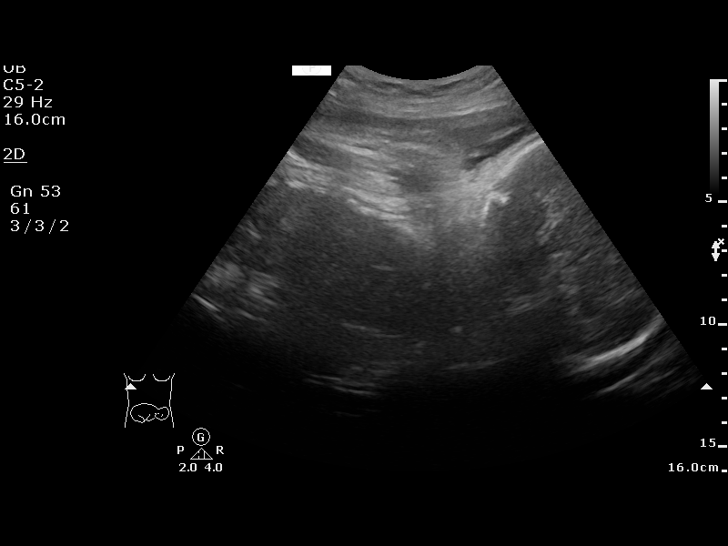
[im 3/13]
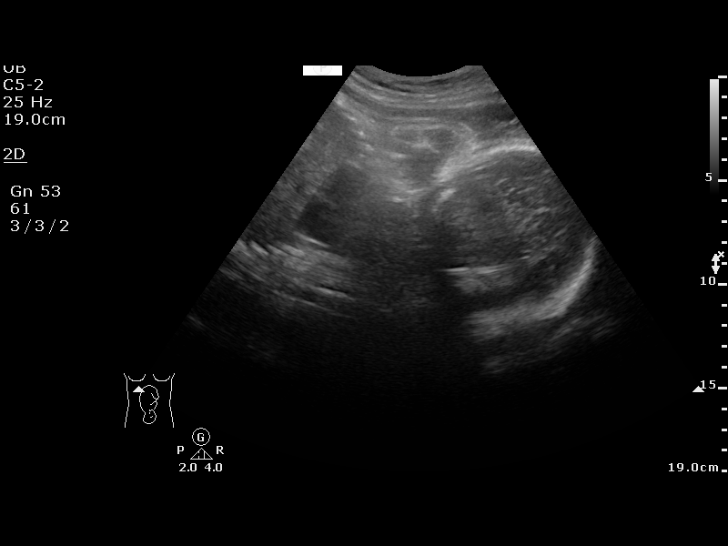
[im 4/13]
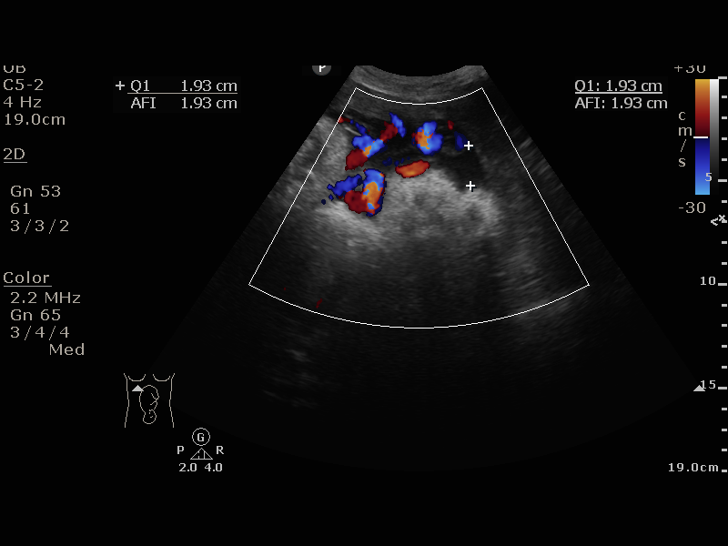
[im 5/13]
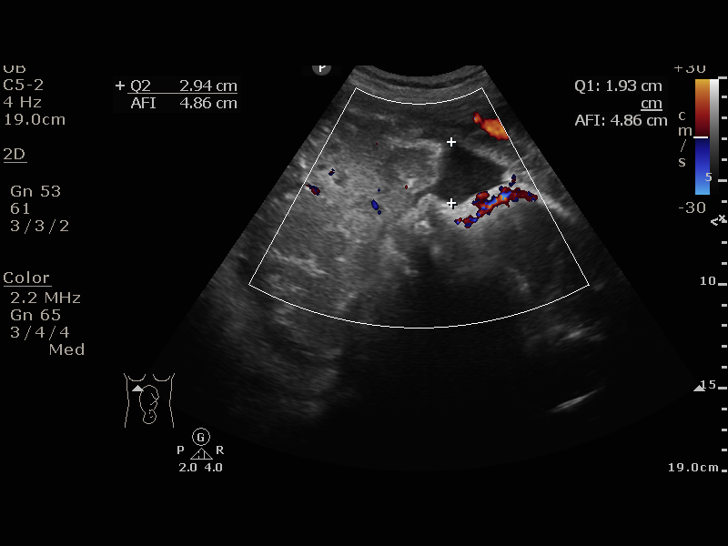
[im 6/13]
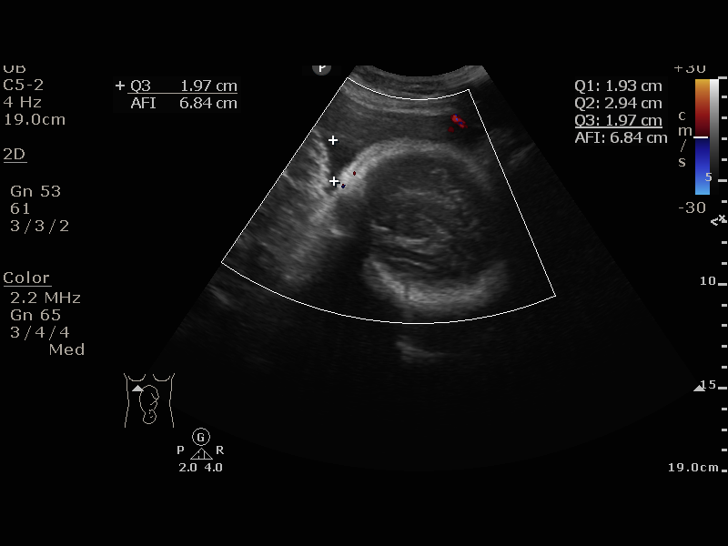
[im 7/13]
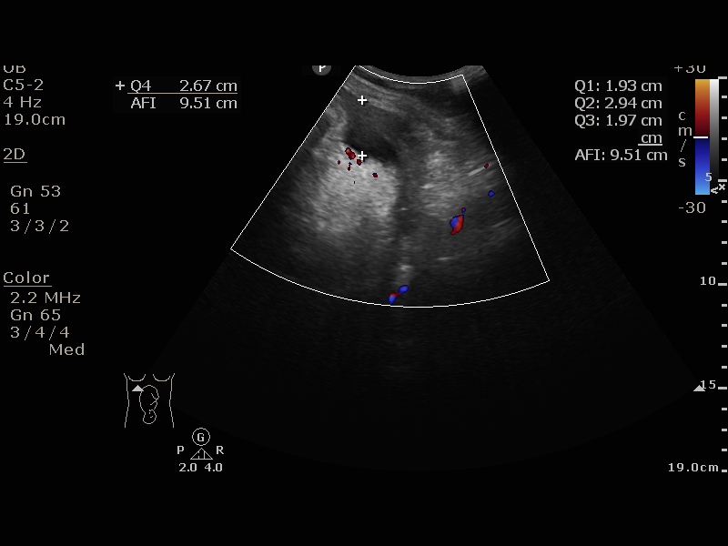
[im 8/13]
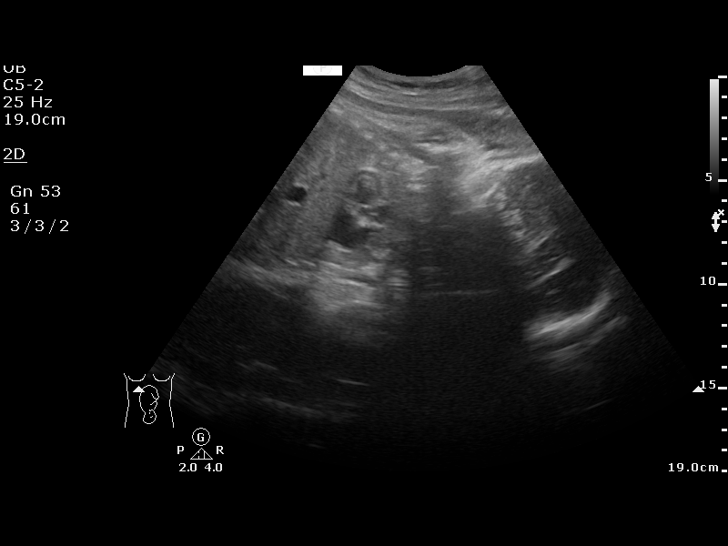
[im 9/13]
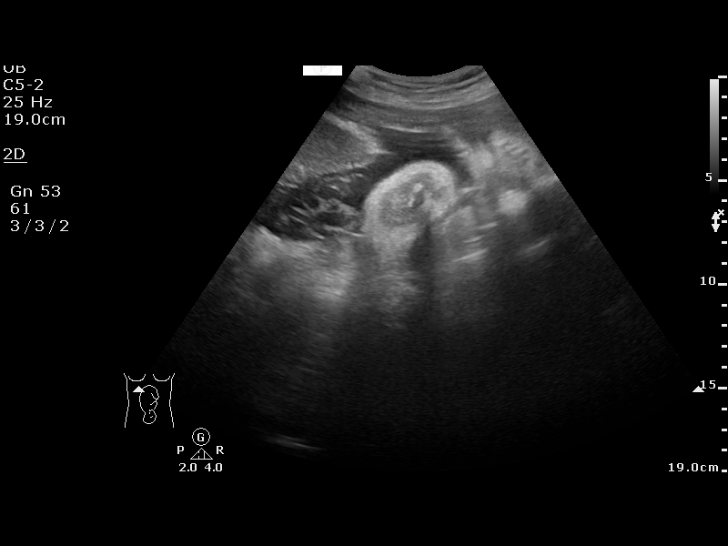
[im 10/13]
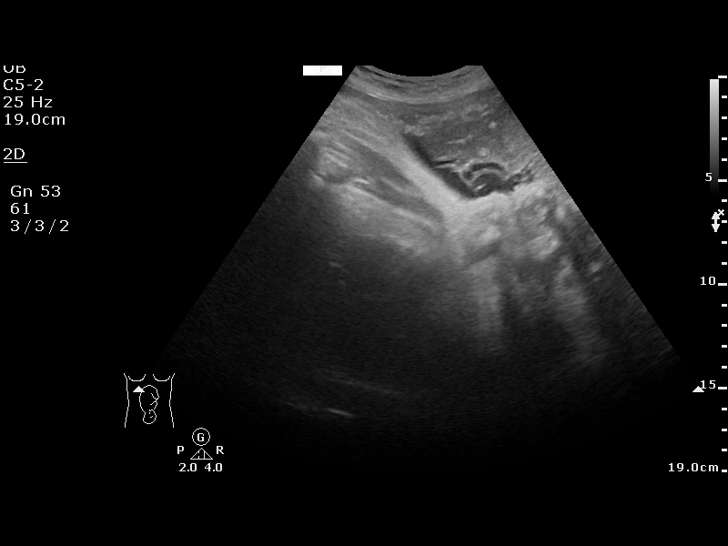
[im 11/13]
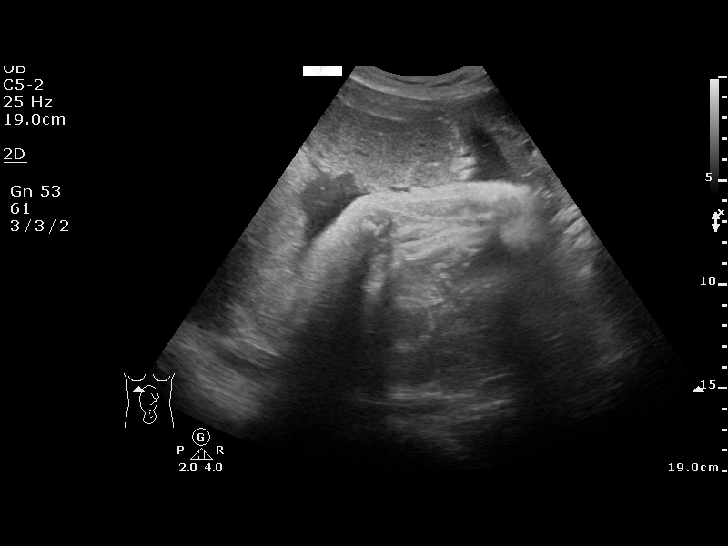
[im 12/13]
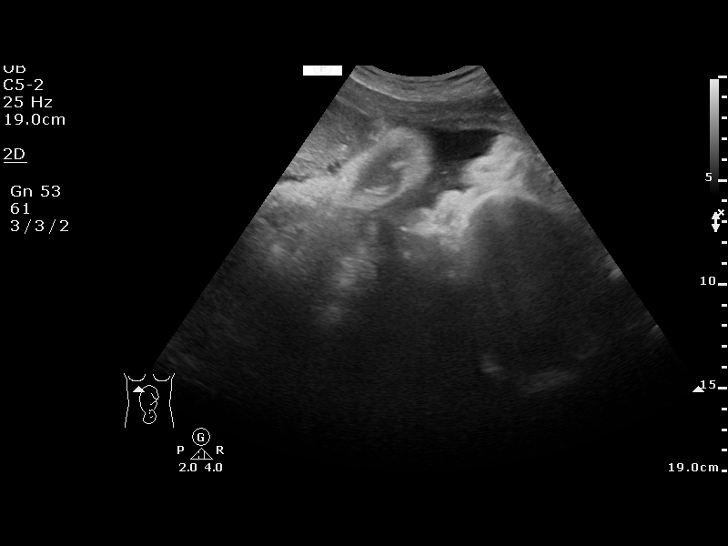
[im 13/13]
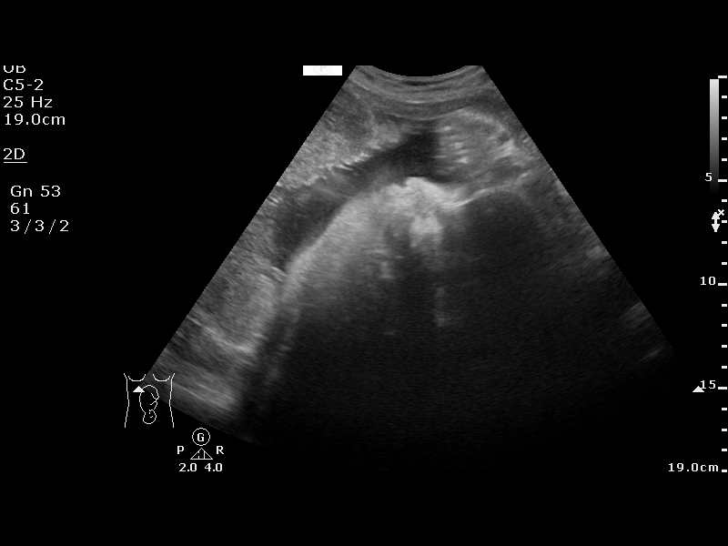

[13 of 13 positions shown; findings below may reference images not displayed]

IMMANUEL [facility]ANGWE

                                                            [address]
                                                            Women's
                                                            [REDACTED]

  1  US FETAL BPP W/NONSTRESS             76818.4      JHEMBOY PADAM
 ----------------------------------------------------------------------

 ----------------------------------------------------------------------
Service(s) Provided

 ----------------------------------------------------------------------
Indications

  36 weeks gestation of pregnancy
  Pre-eclampsia
 ----------------------------------------------------------------------
Vital Signs

                                                Height:        4'10"
Fetal Evaluation

 Num Of Fetuses:         1
 Preg. Location:         Intrauterine
 Cardiac Activity:       Observed
 Presentation:           Cephalic

 Amniotic Fluid
 AFI FV:      Subjectively low-normal

 AFI Sum(cm)     %Tile       Largest Pocket(cm)
 9.51            19

 RUQ(cm)       RLQ(cm)       LUQ(cm)        LLQ(cm)

Biophysical Evaluation

 Amniotic F.V:   Pocket => 2 cm             F. Tone:        Observed
 F. Movement:    Observed                   N.S.T:          Reactive
 F. Breathing:   Not Observed               Score:          [DATE]
OB History

 Gravidity:    2         Term:   1
 Living:       1
Gestational Age

 Best:          36w 3d     Det. By:  Previous Ultrasound      EDD:   03/27/19
                                     (10/28/18)
Comments

 Fetus had variable positions throughout BPP - from
 transverse head on Rt to transverse head on Lt then to
 cephalic
Impression

 BPP [DATE]
Recommendations

 -Continue weekly BPP till delivery.
                  Tarla, Bambucafe
